# Patient Record
Sex: Female | Born: 1975 | Hispanic: Yes | Marital: Married | Smoking: Never smoker
Health system: Southern US, Community
[De-identification: ages and names within clinical notes are randomized; demographics above are authoritative.]

## PROBLEM LIST (undated history)

## (undated) DIAGNOSIS — J45909 Unspecified asthma, uncomplicated: Secondary | ICD-10-CM

## (undated) HISTORY — PX: TUBAL LIGATION: SHX77

---

## 2004-12-03 ENCOUNTER — Emergency Department: Payer: Self-pay | Admitting: Internal Medicine

## 2005-04-20 ENCOUNTER — Emergency Department: Payer: Self-pay | Admitting: Emergency Medicine

## 2005-08-26 ENCOUNTER — Emergency Department: Payer: Self-pay | Admitting: Emergency Medicine

## 2005-09-22 ENCOUNTER — Emergency Department: Payer: Self-pay | Admitting: Internal Medicine

## 2006-06-24 ENCOUNTER — Emergency Department: Payer: Self-pay | Admitting: Emergency Medicine

## 2006-06-24 ENCOUNTER — Other Ambulatory Visit: Payer: Self-pay

## 2006-08-24 ENCOUNTER — Emergency Department: Payer: Self-pay | Admitting: Emergency Medicine

## 2006-09-09 ENCOUNTER — Emergency Department: Payer: Self-pay | Admitting: Emergency Medicine

## 2006-11-03 ENCOUNTER — Emergency Department: Payer: Self-pay | Admitting: Emergency Medicine

## 2007-09-18 ENCOUNTER — Emergency Department: Payer: Self-pay | Admitting: Unknown Physician Specialty

## 2008-09-28 ENCOUNTER — Emergency Department: Payer: Self-pay | Admitting: Emergency Medicine

## 2013-02-24 ENCOUNTER — Ambulatory Visit: Payer: Self-pay | Admitting: Family Medicine

## 2013-02-24 LAB — BASIC METABOLIC PANEL
Anion Gap: 9 (ref 7–16)
BUN: 8 mg/dL (ref 7–18)
Calcium, Total: 9.1 mg/dL (ref 8.5–10.1)
Chloride: 102 mmol/L (ref 98–107)
Creatinine: 0.73 mg/dL (ref 0.60–1.30)
EGFR (Non-African Amer.): >60
Glucose: 91 mg/dL (ref 65–99)

## 2013-02-24 LAB — CBC WITH DIFFERENTIAL/PLATELET
Basophil %: 0.8 %
Eosinophil %: 1.4 %
HCT: 45 % (ref 35.0–47.0)
Lymphocyte #: 1.1 10*3/uL (ref 1.0–3.6)
Lymphocyte %: 15.3 %
MCHC: 32.1 g/dL (ref 32.0–36.0)
MCV: 81 fL (ref 80–100)
Monocyte #: 0.7 x10 3/mm (ref 0.2–0.9)
Monocyte %: 10.2 %
Neutrophil %: 72.3 %
Platelet: 222 10*3/uL (ref 150–440)
RBC: 5.57 10*6/uL — ABNORMAL HIGH (ref 3.80–5.20)
RDW: 13.5 % (ref 11.5–14.5)
WBC: 7.2 10*3/uL (ref 3.6–11.0)

## 2013-02-24 LAB — SEDIMENTATION RATE: Erythrocyte Sed Rate: 7 mm/hr (ref 0–20)

## 2015-10-17 ENCOUNTER — Other Ambulatory Visit
Admission: RE | Admit: 2015-10-17 | Discharge: 2015-10-17 | Disposition: A | Discharge status: Home / Self Care | Payer: 59 | Source: Ambulatory Visit | Attending: Obstetrics and Gynecology | Admitting: Obstetrics and Gynecology

## 2015-10-17 DIAGNOSIS — Z1329 Encounter for screening for other suspected endocrine disorder: Secondary | ICD-10-CM | POA: Diagnosis present

## 2015-10-17 DIAGNOSIS — Z131 Encounter for screening for diabetes mellitus: Secondary | ICD-10-CM | POA: Diagnosis present

## 2015-10-17 DIAGNOSIS — Z13220 Encounter for screening for lipoid disorders: Secondary | ICD-10-CM | POA: Diagnosis not present

## 2015-10-17 DIAGNOSIS — Z113 Encounter for screening for infections with a predominantly sexual mode of transmission: Secondary | ICD-10-CM | POA: Diagnosis not present

## 2015-10-17 DIAGNOSIS — Z1321 Encounter for screening for nutritional disorder: Secondary | ICD-10-CM | POA: Diagnosis not present

## 2015-10-17 DIAGNOSIS — Z136 Encounter for screening for cardiovascular disorders: Secondary | ICD-10-CM | POA: Diagnosis present

## 2015-10-17 LAB — COMPREHENSIVE METABOLIC PANEL
ALBUMIN: 4.4 g/dL (ref 3.5–5.0)
ALT: 20 U/L (ref 14–54)
ANION GAP: 9 (ref 5–15)
AST: 22 U/L (ref 15–41)
Alkaline Phosphatase: 40 U/L (ref 38–126)
BUN: 10 mg/dL (ref 6–20)
CO2: 28 mmol/L (ref 22–32)
Calcium: 9.4 mg/dL (ref 8.9–10.3)
Chloride: 103 mmol/L (ref 101–111)
Creatinine, Ser: 0.62 mg/dL (ref 0.44–1.00)
GFR calc Af Amer: >60 mL/min (ref >60)
GFR calc non Af Amer: >60 mL/min (ref >60)
GLUCOSE: 106 mg/dL — AB (ref 65–99)
POTASSIUM: 4 mmol/L (ref 3.5–5.1)
SODIUM: 140 mmol/L (ref 135–145)
Total Bilirubin: 0.9 mg/dL (ref 0.3–1.2)
Total Protein: 8 g/dL (ref 6.5–8.1)

## 2015-10-17 LAB — CBC WITH DIFFERENTIAL/PLATELET
BASOS ABS: 0.1 10*3/uL (ref 0–0.1)
Basophils Relative: 1 %
EOS ABS: 0 10*3/uL (ref 0–0.7)
EOS PCT: 1 %
HCT: 42.2 % (ref 35.0–47.0)
Hemoglobin: 13.6 g/dL (ref 12.0–16.0)
LYMPHS ABS: 1.7 10*3/uL (ref 1.0–3.6)
Lymphocytes Relative: 25 %
MCH: 26.1 pg (ref 26.0–34.0)
MCHC: 32.2 g/dL (ref 32.0–36.0)
MCV: 81 fL (ref 80.0–100.0)
MONO ABS: 0.5 10*3/uL (ref 0.2–0.9)
Monocytes Relative: 8 %
Neutro Abs: 4.4 10*3/uL (ref 1.4–6.5)
Neutrophils Relative %: 65 %
PLATELETS: 250 10*3/uL (ref 150–440)
RBC: 5.2 MIL/uL (ref 3.80–5.20)
RDW: 14.2 % (ref 11.5–14.5)
WBC: 6.7 10*3/uL (ref 3.6–11.0)

## 2015-10-17 LAB — RAPID HIV SCREEN (HIV 1/2 AB+AG)
HIV 1/2 Antibodies: NONREACTIVE
HIV-1 P24 Antigen - HIV24: NONREACTIVE

## 2015-10-17 LAB — TSH: TSH: 2.285 u[IU]/mL (ref 0.350–4.500)

## 2015-10-17 LAB — LIPID PANEL
CHOL/HDL RATIO: 2.9 ratio
Cholesterol: 170 mg/dL (ref 0–200)
HDL: 58 mg/dL (ref >40)
LDL CALC: 102 mg/dL — AB (ref 0–99)
Triglycerides: 48 mg/dL (ref <150)
VLDL: 10 mg/dL (ref 0–40)

## 2015-10-18 LAB — VITAMIN D 25 HYDROXY (VIT D DEFICIENCY, FRACTURES): VIT D 25 HYDROXY: 18.6 ng/mL — AB (ref 30.0–100.0)

## 2015-10-18 LAB — RPR: RPR: NONREACTIVE

## 2015-10-30 ENCOUNTER — Emergency Department: Payer: 59

## 2015-10-30 ENCOUNTER — Emergency Department
Admission: EM | Admit: 2015-10-30 | Discharge: 2015-10-30 | Disposition: A | Discharge status: Home / Self Care | Payer: 59 | Attending: Emergency Medicine | Admitting: Emergency Medicine

## 2015-10-30 ENCOUNTER — Encounter: Payer: Self-pay | Admitting: Emergency Medicine

## 2015-10-30 DIAGNOSIS — R0602 Shortness of breath: Secondary | ICD-10-CM | POA: Insufficient documentation

## 2015-10-30 DIAGNOSIS — R42 Dizziness and giddiness: Secondary | ICD-10-CM | POA: Insufficient documentation

## 2015-10-30 DIAGNOSIS — J45909 Unspecified asthma, uncomplicated: Secondary | ICD-10-CM | POA: Insufficient documentation

## 2015-10-30 DIAGNOSIS — R0789 Other chest pain: Secondary | ICD-10-CM | POA: Insufficient documentation

## 2015-10-30 DIAGNOSIS — R079 Chest pain, unspecified: Secondary | ICD-10-CM

## 2015-10-30 HISTORY — DX: Unspecified asthma, uncomplicated: J45.909

## 2015-10-30 LAB — CBC
HEMATOCRIT: 40.4 % (ref 35.0–47.0)
Hemoglobin: 13.3 g/dL (ref 12.0–16.0)
MCH: 26.5 pg (ref 26.0–34.0)
MCHC: 33 g/dL (ref 32.0–36.0)
MCV: 80.4 fL (ref 80.0–100.0)
PLATELETS: 261 10*3/uL (ref 150–440)
RBC: 5.02 MIL/uL (ref 3.80–5.20)
RDW: 14.2 % (ref 11.5–14.5)
WBC: 8.1 10*3/uL (ref 3.6–11.0)

## 2015-10-30 LAB — BASIC METABOLIC PANEL
Anion gap: 6 (ref 5–15)
BUN: 10 mg/dL (ref 6–20)
CHLORIDE: 101 mmol/L (ref 101–111)
CO2: 30 mmol/L (ref 22–32)
CREATININE: 0.72 mg/dL (ref 0.44–1.00)
Calcium: 9.5 mg/dL (ref 8.9–10.3)
GFR calc Af Amer: >60 mL/min (ref >60)
GFR calc non Af Amer: >60 mL/min (ref >60)
GLUCOSE: 94 mg/dL (ref 65–99)
POTASSIUM: 3.7 mmol/L (ref 3.5–5.1)
Sodium: 137 mmol/L (ref 135–145)

## 2015-10-30 LAB — POCT PREGNANCY, URINE: Preg Test, Ur: NEGATIVE

## 2015-10-30 LAB — TROPONIN I

## 2015-10-30 MED ORDER — MECLIZINE HCL 25 MG PO TABS
25.0000 mg | ORAL_TABLET | Freq: Once | ORAL | Status: AC
  Administered 2015-10-30: 25 mg via ORAL
  Filled 2015-10-30: qty 1

## 2015-10-30 MED ORDER — MECLIZINE HCL 25 MG PO TABS: 25.0000 mg | ORAL_TABLET | Freq: Three times a day (TID) | ORAL | Status: DC | PRN

## 2015-10-30 MED ORDER — GI COCKTAIL ~~LOC~~
30.0000 mL | Freq: Once | ORAL | Status: AC
  Administered 2015-10-30: 30 mL via ORAL
  Filled 2015-10-30: qty 30

## 2015-10-30 NOTE — ED Notes (Signed)
Pt with asthma symptoms x3 weeks , chest tightness, off balance, and weakness x2 weeks. The last 2 weeks when awaking needs to have a breathing treatment (tightness).

## 2015-10-30 NOTE — ED Provider Notes (Signed)
[facility] Emergency Department Provider Note    ____________________________________________  Time seen: ~1450  I have reviewed the triage vital signs and the nursing notes.   HISTORY  Chief Complaint Chest Pain   History limited by: Not Limited   HPI Hailey BillingsCarla M Clark is a 40 y.o. female  who presents to the emergency department today with primary complaint of chest pain. She states that is located on the left chest. It has been present for a few weeks. Does not sound like there are any factors that make it worse. She states that today has been constant throughout the day. Patient states she also has been having issues with her asthma. She states she's been having increasing shortness of breath for at least the past 3 weeks. She states that it is worse in the morning. She will use her inhaler and then will get better. In addition the patient states she has also been feeling slightly dizzy. She states this will happen sometimes when she is walking. This too is been going on for a little while but has been getting worse. Patient denies any recent fevers.   Past Medical History  Diagnosis Date  . Asthma     There are no active problems to display for this patient.   History reviewed. No pertinent past surgical history.  No current outpatient prescriptions on file.  Allergies Review of patient's allergies indicates no known allergies.  No family history on file.  Social History Social History  Substance Use Topics  . Smoking status: Never Smoker   . Smokeless tobacco: None  . Alcohol Use: None    Review of Systems  Constitutional: Negative for fever. Cardiovascular: Positive for chest pain. Respiratory: Positive for shortness of breath. Gastrointestinal: Negative for abdominal pain, vomiting and diarrhea. Neurological: Negative for headaches, focal weakness or numbness.  10-point ROS otherwise  negative.  ____________________________________________   PHYSICAL EXAM:  VITAL SIGNS: ED Triage Vitals  Enc Vitals Group     BP 10/30/15 1440 122/56 mmHg     Pulse Rate 10/30/15 1440 64     Resp 10/30/15 1440 18     Temp 10/30/15 1440 97.8 F (36.6 C)     Temp Source 10/30/15 1440 Oral     SpO2 10/30/15 1440 98 %     Weight 10/30/15 1440 150 lb (68.04 kg)     Height 10/30/15 1440 5\' 2"  (1.575 m)     Head Cir --      Peak Flow --      Pain Score 10/30/15 1441 3   Constitutional: Alert and oriented. Well appearing and in no distress. Eyes: Conjunctivae are normal. PERRL. Normal extraocular movements. ENT   Head: Normocephalic and atraumatic.   Nose: No congestion/rhinnorhea.   Mouth/Throat: Mucous membranes are moist.   Neck: No stridor. Hematological/Lymphatic/Immunilogical: No cervical lymphadenopathy. Cardiovascular: Normal rate, regular rhythm.  No murmurs, rubs, or gallops. Respiratory: Normal respiratory effort without tachypnea nor retractions. Breath sounds are clear and equal bilaterally. No wheezes/rales/rhonchi. Gastrointestinal: Soft and nontender. No distention. There is no CVA tenderness.  Genitourinary: Deferred Musculoskeletal: Normal range of motion in all extremities. No joint effusions.  No lower extremity tenderness nor edema. Neurologic:  Normal speech and language. Face symmetric. Tongue midline. Extraocular motion is intact. Web designererl. Strength 5 out of 5 in upper and lower extremities. No drift. Finger to nose normal. Sensation grossly intact. No gross focal neurologic deficits are appreciated.  Skin:  Skin is warm, dry and intact. No rash  noted. Psychiatric: Mood and affect are normal. Speech and behavior are normal. Patient exhibits appropriate insight and judgment.  ____________________________________________    LABS (pertinent positives/negatives)  Labs Reviewed  BASIC METABOLIC PANEL  CBC  TROPONIN I      ____________________________________________   EKG  I, Phineas Semen, attending physician, personally viewed and interpreted this EKG  EKG Time: 1436 Rate: 63 Rhythm: Normal sinus rhythm Axis: normal Intervals: qtc 392 QRS: narrow ST changes: no st elevation Impression: normal ekg  ____________________________________________    RADIOLOGY  CXR  IMPRESSION: No active cardiopulmonary disease.  ____________________________________________   PROCEDURES  Procedure(s) performed: None  Critical Care performed: No  ____________________________________________   INITIAL IMPRESSION / ASSESSMENT AND PLAN / ED COURSE  Pertinent labs & imaging results that were available during my care of the patient were reviewed by me and considered in my medical decision making (see chart for details).  Patient presented to the emergency department today because of concerns for chest pain, shortness breath and some dizziness. On exam patient's lungs are clear. Chest x-ray without concerning findings. Additionally patient has negative troponin. Given that her symptoms have been going on for 3 weeks and that they're constant today would expect some elevation of his truly represent ACS. Will plan on trying GI cocktail. Additionally will give meclizine.  ----------------------------------------- 5:47 PM on 10/30/2015 -----------------------------------------  Patient did not feel any significant improvement with the GI cocktail. This point unclear etiology of the chest pain however and I do doubt ACS. Doubt PE. Will plan on giving patient Antivert. Will give patient primary care follow-up options.  ____________________________________________   FINAL CLINICAL IMPRESSION(S) / ED DIAGNOSES  Final diagnoses:  Dizziness  Shortness of breath  Chest pain, unspecified chest pain type     Phineas Semen, MD 10/30/15 1747

## 2015-10-30 NOTE — ED Notes (Signed)
MD at bedside. 

## 2015-10-30 NOTE — Discharge Instructions (Signed)
Please seek medical attention for any high fevers, chest pain, shortness of breath, change in behavior, persistent vomiting, bloody stool or any other new or concerning symptoms. ° ° °Nonspecific Chest Pain °It is often hard to find the cause of chest pain. There is always a chance that your pain could be related to something serious, such as a heart attack or a blood clot in your lungs. Chest pain can also be caused by conditions that are not life-threatening. If you have chest pain, it is very important to follow up with your doctor. ° °HOME CARE °· If you were prescribed an antibiotic medicine, finish it all even if you start to feel better. °· Avoid any activities that cause chest pain. °· Do not use any tobacco products, including cigarettes, chewing tobacco, or electronic cigarettes. If you need help quitting, ask your doctor. °· Do not drink alcohol. °· Take medicines only as told by your doctor. °· Keep all follow-up visits as told by your doctor. This is important. This includes any further testing if your chest pain does not go away. °· Your doctor may tell you to keep your head raised (elevated) while you sleep. °· Make lifestyle changes as told by your doctor. These may include: °¨ Getting regular exercise. Ask your doctor to suggest some activities that are safe for you. °¨ Eating a heart-healthy diet. Your doctor or a diet specialist (dietitian) can help you to learn healthy eating options. °¨ Maintaining a healthy weight. °¨ Managing diabetes, if necessary. °¨ Reducing stress. °GET HELP IF: °· Your chest pain does not go away, even after treatment. °· You have a rash with blisters on your chest. °· You have a fever. °GET HELP RIGHT AWAY IF: °· Your chest pain is worse. °· You have an increasing cough, or you cough up blood. °· You have severe belly (abdominal) pain. °· You feel extremely weak. °· You pass out (faint). °· You have chills. °· You have sudden, unexplained chest discomfort. °· You have  sudden, unexplained discomfort in your arms, back, neck, or jaw. °· You have shortness of breath at any time. °· You suddenly start to sweat, or your skin gets clammy. °· You feel nauseous. °· You vomit. °· You suddenly feel light-headed or dizzy. °· Your heart begins to beat quickly, or it feels like it is skipping beats. °These symptoms may be an emergency. Do not wait to see if the symptoms will go away. Get medical help right away. Call your local emergency services (911 in the [place] Do not drive yourself to the hospital. °  °This information is not intended to replace advice given to you by your health care provider. Make sure you discuss any questions you have with your health care provider. °  °Document Released: 11/18/2007 Document Revised: 06/22/2014 Document Reviewed: 01/05/2014 °Elsevier Interactive Patient Education ©2016 [employer] ° °

## 2016-07-03 ENCOUNTER — Ambulatory Visit
Admission: EM | Admit: 2016-07-03 | Discharge: 2016-07-03 | Disposition: A | Discharge status: Home / Self Care | Payer: 59 | Attending: Family Medicine | Admitting: Family Medicine

## 2016-07-03 ENCOUNTER — Ambulatory Visit (INDEPENDENT_AMBULATORY_CARE_PROVIDER_SITE_OTHER): Payer: 59

## 2016-07-03 ENCOUNTER — Encounter: Payer: Self-pay | Admitting: Emergency Medicine

## 2016-07-03 DIAGNOSIS — J069 Acute upper respiratory infection, unspecified: Secondary | ICD-10-CM

## 2016-07-03 DIAGNOSIS — J209 Acute bronchitis, unspecified: Secondary | ICD-10-CM

## 2016-07-03 DIAGNOSIS — R6889 Other general symptoms and signs: Secondary | ICD-10-CM

## 2016-07-03 LAB — RAPID INFLUENZA A&B ANTIGENS (ARMC ONLY)
INFLUENZA A (ARMC): NEGATIVE
INFLUENZA B (ARMC): NEGATIVE

## 2016-07-03 MED ORDER — PREDNISONE 10 MG (21) PO TBPK: ORAL_TABLET | ORAL | 0 refills | Status: DC

## 2016-07-03 MED ORDER — HYDROCOD POLST-CPM POLST ER 10-8 MG/5ML PO SUER: 5.0000 mL | Freq: Two times a day (BID) | ORAL | 0 refills | Status: DC | PRN

## 2016-07-03 MED ORDER — ALBUTEROL SULFATE HFA 108 (90 BASE) MCG/ACT IN AERS: 2.0000 | INHALATION_SPRAY | RESPIRATORY_TRACT | 1 refills | Status: DC | PRN

## 2016-07-03 MED ORDER — IPRATROPIUM-ALBUTEROL 0.5-2.5 (3) MG/3ML IN SOLN
3.0000 mL | Freq: Once | RESPIRATORY_TRACT | Status: AC
  Administered 2016-07-03: 3 mL via RESPIRATORY_TRACT

## 2016-07-03 MED ORDER — AZITHROMYCIN 250 MG PO TABS: ORAL_TABLET | ORAL | 0 refills | Status: DC

## 2016-07-03 MED ORDER — METHYLPREDNISOLONE SODIUM SUCC 125 MG IJ SOLR
125.0000 mg | Freq: Once | INTRAMUSCULAR | Status: AC
  Administered 2016-07-03: 125 mg via INTRAMUSCULAR

## 2016-07-03 NOTE — ED Provider Notes (Addendum)
[facility]    CSN: [id number] Arrival date & time: 07/03/16  0854     History   Chief Complaint Chief Complaint  Patient presents with  . Headache  . Diarrhea  . Cough    HPI Hailey BillingsCarla M Clark is a 41 y.o. female.   Patient's here because of URI-like symptoms. She states she was at work Tuesday felt lightheaded dizzy and felt like she was brought to pass out and felt nauseous. She went to lunch was unable to eat came back to her workplace and had a first responder who saw her and found that her blood pressure was low on Tuesday. Apparently Wednesday she ached all during the day felt miserable continued to cough 18 to have chest tightness as well. She has asthma and breathing treatments but didn't help much. Thursday she felt worse came here to be seen and evaluated since we also closed on Thursday she went home still going to the hospital. She is back today on Friday just states she feels so much worse and feels miserable. She's had chills and fever and some myalgia. She's been coughing she feels that she has flu also feels nauseous and has had diarrhea as well. Is not really coughing up much but she feels "tight in chest since she's taken several breathing treatments which does not help much. As stated she does have history of asthma. Does not smoke states that she was around several sick people last weekend as she is seeing her brother who was admitted to the hospital. She never smoked. She denies any pertinent family medical history relevant to today's visit other than her brother being sick with URI illness   The history is provided by the patient. No language interpreter was used.  Headache  Pain location:  Generalized Chronicity:  New Similar to prior headaches: no   Context: activity   Relieved by:  Nothing Ineffective treatments:  None tried Associated symptoms: cough and diarrhea   Cough:    Cough characteristics:  Productive and non-productive   Sputum  characteristics:  Yellow and green   Severity:  Moderate   Onset quality:  Sudden   Timing:  Constant   Progression:  Worsening   Chronicity:  New Diarrhea  Associated symptoms: headaches   Cough  Associated symptoms: headaches     Past Medical History:  Diagnosis Date  . Asthma     There are no active problems to display for this patient.   History reviewed. No pertinent surgical history.  OB History    No data available       Home Medications    Prior to Admission medications   Medication Sig Start Date End Date Taking? Authorizing Provider  albuterol (PROVENTIL HFA;VENTOLIN HFA) 108 (90 Base) MCG/ACT inhaler Inhale 2 puffs into the lungs every 4 (four) hours as needed for wheezing or shortness of breath. 07/03/16   Hassan RowanEugene Keeghan Mcintire, MD  azithromycin (ZITHROMAX Z-PAK) 250 MG tablet Take 2 tablets first day and then 1 po a day for 4 days 07/03/16   Hassan RowanEugene Braylen Denunzio, MD  chlorpheniramine-HYDROcodone [facility](TUSSIONEX PENNKINETIC ER) 10-8 MG/5ML SUER Take 5 mLs by mouth every 12 (twelve) hours as needed for cough. 07/03/16   Hassan RowanEugene Josetta Wigal, MD  meclizine (ANTIVERT) 25 MG tablet Take 1 tablet (25 mg total) by mouth 3 (three) times daily as needed for dizziness. 10/30/15   Phineas SemenGraydon Goodman, MD  predniSONE (STERAPRED UNI-PAK 21 TAB) 10 MG (21) TBPK tablet Sig 6 tablet day 1, 5 tablets  day 2, 4 tablets day 3,,3tablets day 4, 2 tablets day 5, 1 tablet day 6 take all tablets orally 07/03/16   Hassan Rowan, MD    Family History History reviewed. No pertinent family history.  Social History Social History  Substance Use Topics  . Smoking status: Never Smoker  . Smokeless tobacco: Never Used  . Alcohol use Not on file     Allergies   Patient has no known allergies.   Review of Systems Review of Systems  Respiratory: Positive for cough.   Gastrointestinal: Positive for diarrhea.  Neurological: Positive for headaches.  All other systems reviewed and are negative.    Physical Exam Triage  Vital Signs ED Triage Vitals  Enc Vitals Group     BP 07/03/16 0906 123/65     Pulse Rate 07/03/16 0906 (!) 110     Resp 07/03/16 0906 16     Temp 07/03/16 0906 98.8 F (37.1 C)     Temp Source 07/03/16 0906 Oral     SpO2 07/03/16 0906 99 %     Weight 07/03/16 0905 150 lb (68 kg)     Height 07/03/16 0905 5\' 2"  (1.575 m)     Head Circumference --      Peak Flow --      Pain Score 07/03/16 0907 8     Pain Loc --      Pain Edu? --      Excl. in GC? --    No data found.   Updated Vital Signs BP 123/65 (BP Location: Left Arm)   Pulse (!) 110   Temp 98.8 F (37.1 C) (Oral)   Resp 16   Ht 5\' 2"  (1.575 m)   Wt 150 lb (68 kg)   LMP 07/02/2016 (Exact Date) Comment: denies preg  SpO2 99%   BMI 27.44 kg/m   Visual Acuity Right Eye Distance:   Left Eye Distance:   Bilateral Distance:    Right Eye Near:   Left Eye Near:    Bilateral Near:     Physical Exam  Constitutional: She appears well-developed and well-nourished. She appears ill.  Eyes: Pupils are equal, round, and reactive to light.  Neck: Normal range of motion. Neck supple. No tracheal deviation present. No thyromegaly present.  Cardiovascular: Normal rate and regular rhythm.   Pulmonary/Chest: She has wheezes. She has rales in the left lower field.  Musculoskeletal: Normal range of motion.  Neurological: She is alert.  Skin: Skin is warm.  Psychiatric: She has a normal mood and affect.  Vitals reviewed.    UC Treatments / Results  Labs (all labs ordered are listed, but only abnormal results are displayed) Labs Reviewed  RAPID INFLUENZA A&B ANTIGENS [facility] ONLY)    EKG  EKG Interpretation None       Radiology Dg Chest 2 View  Result Date: 07/03/2016 CLINICAL DATA:  Cough EXAM: CHEST  2 VIEW COMPARISON:  10/30/15 FINDINGS: Normal heart size and mediastinal contours. No acute infiltrate or edema. No effusion or pneumothorax. 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 No acute osseous findings. IMPRESSION: No active  cardiopulmonary disease. Electronically Signed   By: Marnee Spring M.D.   On: 07/03/2016 10:22    Procedures Procedures (including critical care time)  Medications Ordered in UC Medications  methylPREDNISolone sodium succinate (SOLU-MEDROL) 125 mg/2 mL injection 125 mg (125 mg Intramuscular Given 07/03/16 1007)  ipratropium-albuterol (DUONEB) 0.5-2.5 (3) MG/3ML nebulizer solution 3 mL (3 mLs Nebulization Given 07/03/16 1009)    Results for  orders placed or performed during the hospital encounter of 07/03/16  Rapid Influenza A&B Antigens ([facility] only)  Result Value Ref Range   Influenza A ([facility]) NEGATIVE NEGATIVE   Influenza B ([facility]) NEGATIVE NEGATIVE   Initial Impression / Assessment and Plan / UC Course  I have reviewed the triage vital signs and the nursing notes.  Pertinent labs & imaging results that were available during my care of the patient were reviewed by me and considered in my medical decision making (see chart for details).   in the patient did have some mild left lower lung field posteriorly chest x-ray was negative. We'll place on Zithromax Z-Pak and Tussionex for cough 1 teaspoon twice a day, albuterol inhaler and will also place her on 6 day prednisone taper pack as well. Given her note for work for Friday Saturday and Sunday with instructions for her to return to work on Monday I cannot give her for Thursday as she requested.   Final Clinical Impressions(s) / UC Diagnoses   Final diagnoses:  Upper respiratory tract infection, unspecified type  Flu-like symptoms  Bronchospasm with bronchitis, acute    New Prescriptions New Prescriptions   ALBUTEROL (PROVENTIL HFA;VENTOLIN HFA) 108 (90 BASE) MCG/ACT INHALER    Inhale 2 puffs into the lungs every 4 (four) hours as needed for wheezing or shortness of breath.   AZITHROMYCIN (ZITHROMAX Z-PAK) 250 MG TABLET    Take 2 tablets first day and then 1 po a day for 4 days   CHLORPHENIRAMINE-HYDROCODONE (TUSSIONEX PENNKINETIC  ER) 10-8 MG/5ML SUER    Take 5 mLs by mouth every 12 (twelve) hours as needed for cough.   PREDNISONE (STERAPRED UNI-PAK 21 TAB) 10 MG (21) TBPK TABLET    Sig 6 tablet day 1, 5 tablets day 2, 4 tablets day 3,,3tablets day 4, 2 tablets day 5, 1 tablet day 6 take all tablets orally    Note: This dictation was prepared with Dragon dictation along with smaller phrase technology. Any transcriptional errors that result from this process are unintentional.   Hassan Rowan, MD 07/03/16 1054    Hassan Rowan, MD 07/03/16 [phone]

## 2016-07-03 NOTE — ED Triage Notes (Signed)
Patient c/o cough, chest congestion, diarrhea, SOB, and HAs that started on Tuesday.

## 2017-01-01 IMAGING — CR DG CHEST 2V
1 series · 2 of 2 positions shown · non-contrast
Comparison: 02/24/2013

CLINICAL DATA: Chest pain for 3 weeks. Weakness in off balance
yesterday.

EXAM:
CHEST  2 VIEW

[Series 1: w chest pa · 0.14mm/px · 2 of 2 slices shown]
[im 1/2]
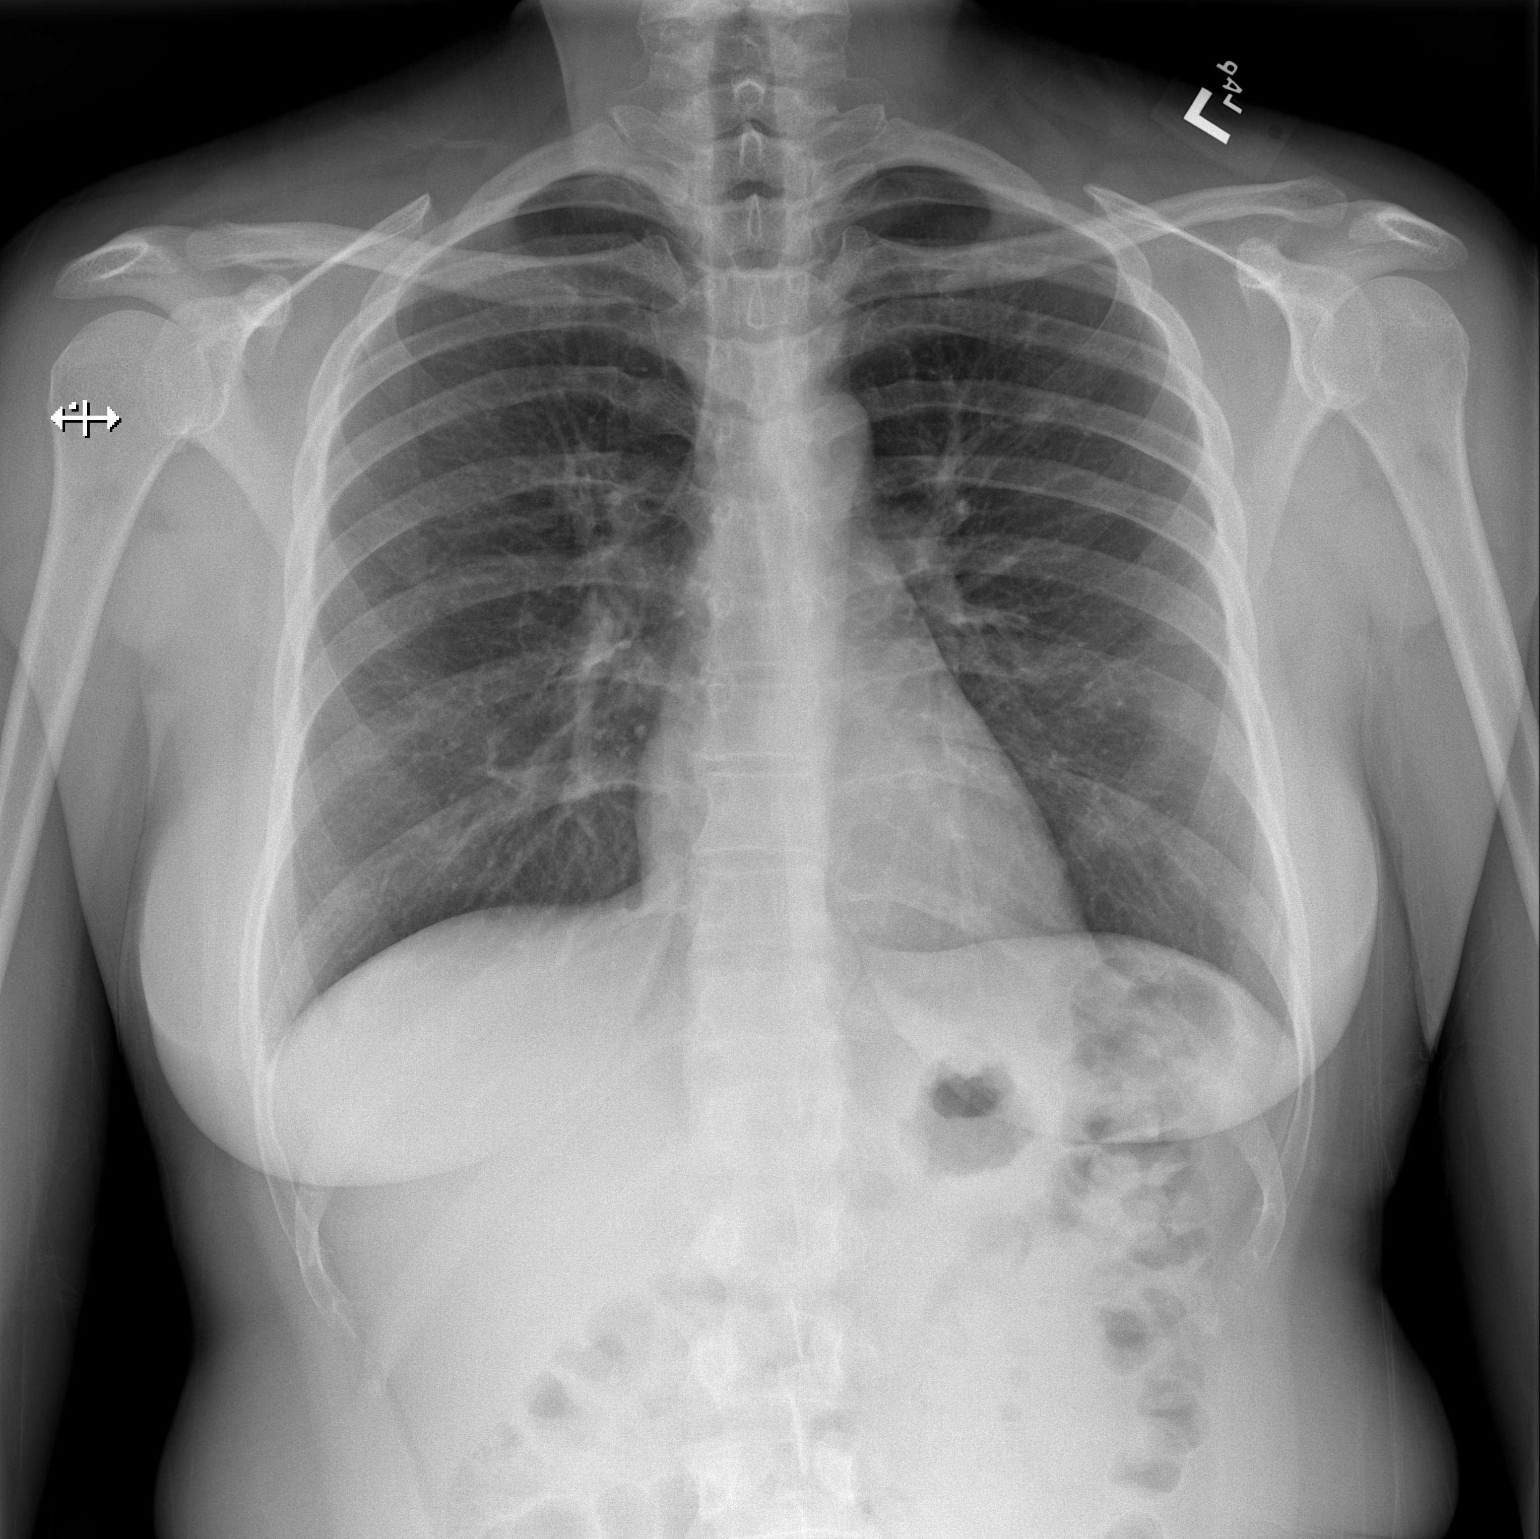
[im 2/2]
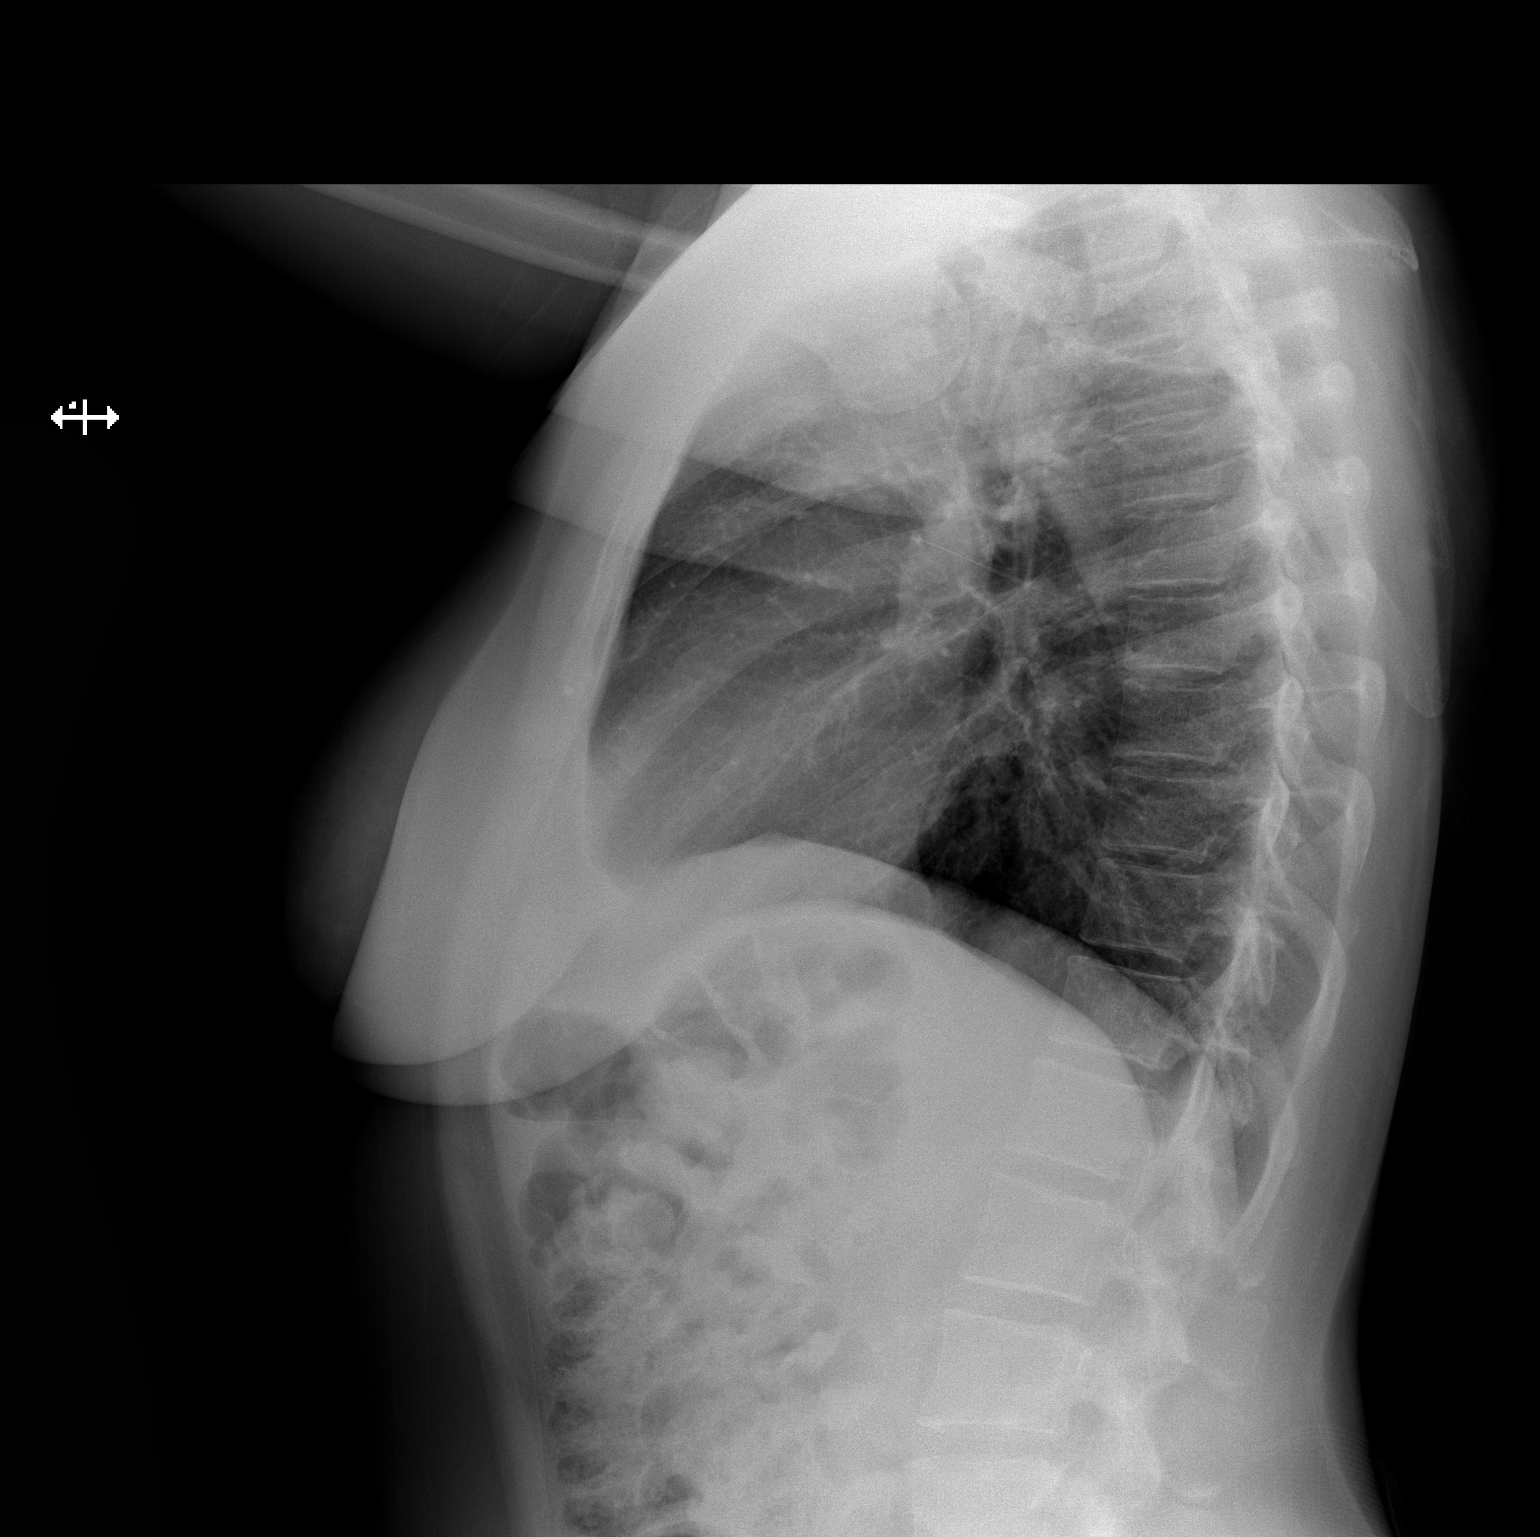

[2 of 2 positions shown; findings below may reference images not displayed]

FINDINGS: The cardiomediastinal silhouette is within normal limits. The lungs
are well inflated and clear. There is no evidence of pleural
effusion or pneumothorax. No acute osseous abnormality is
identified.
IMPRESSION: No active cardiopulmonary disease.

## 2017-09-05 IMAGING — CR DG CHEST 2V
2 series · 2 of 2 positions shown · non-contrast
Comparison: 10/30/15

CLINICAL DATA: Cough

EXAM:
CHEST  2 VIEW

[chest pa]
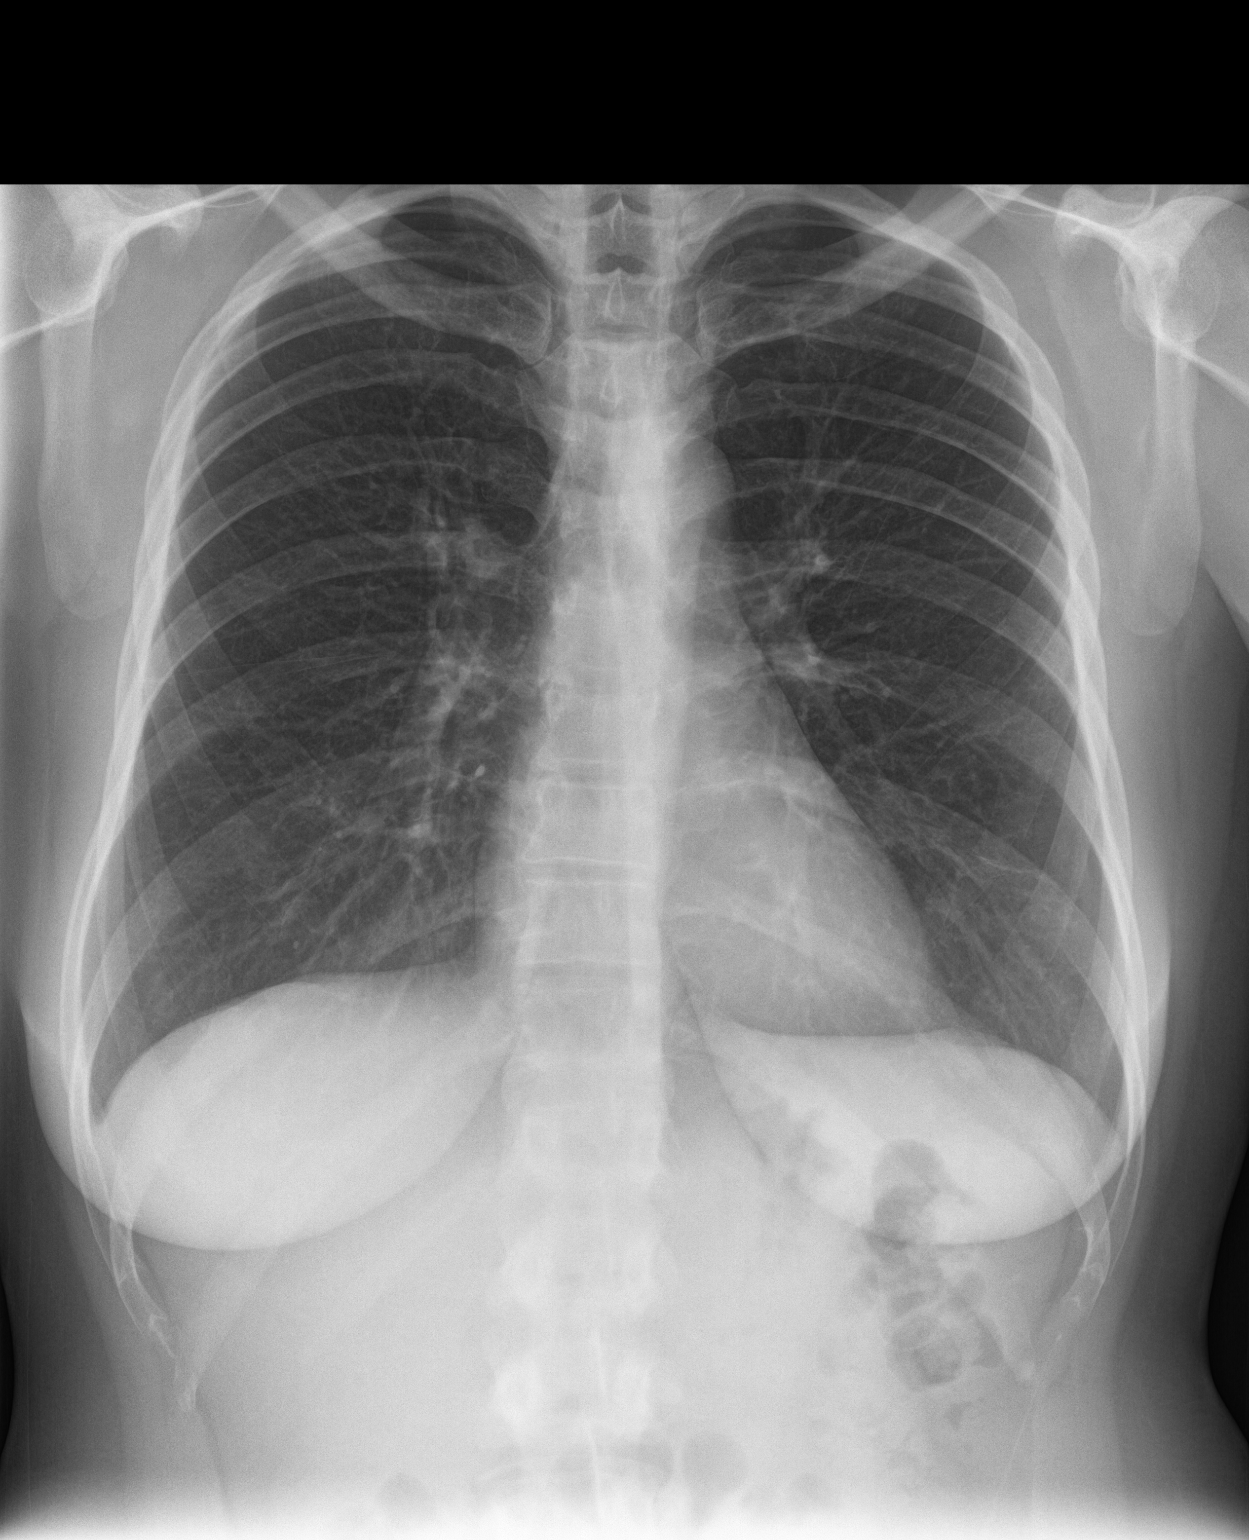

[chest lat]
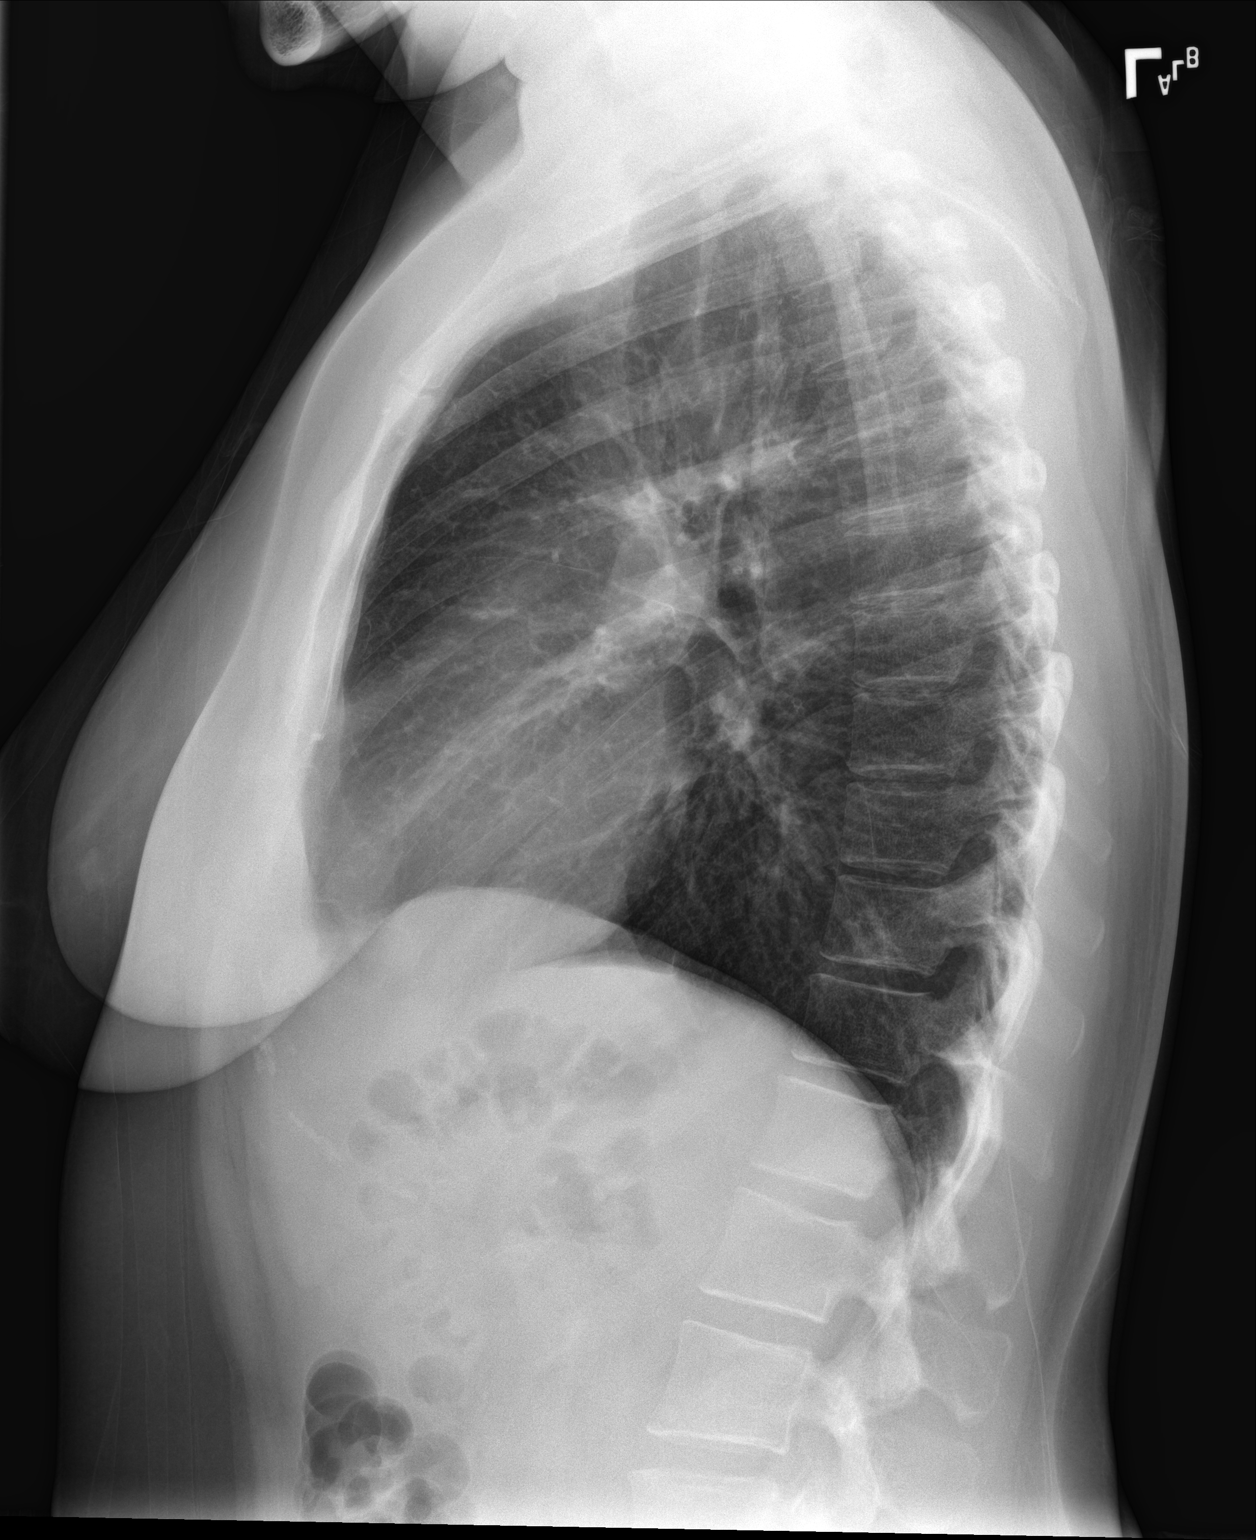

[2 of 2 positions shown; findings below may reference images not displayed]

FINDINGS: Normal heart size and mediastinal contours. No acute infiltrate or
edema. No effusion or pneumothorax. Bifid anterior left 5th rib. No
acute osseous findings.
IMPRESSION: No active cardiopulmonary disease.

## 2017-09-19 ENCOUNTER — Encounter: Payer: Self-pay | Admitting: Emergency Medicine

## 2017-09-19 ENCOUNTER — Emergency Department
Admission: EM | Admit: 2017-09-19 | Discharge: 2017-09-19 | Disposition: A | Discharge status: Home / Self Care | Payer: 59 | Attending: Emergency Medicine | Admitting: Emergency Medicine

## 2017-09-19 DIAGNOSIS — L237 Allergic contact dermatitis due to plants, except food: Secondary | ICD-10-CM | POA: Insufficient documentation

## 2017-09-19 DIAGNOSIS — R21 Rash and other nonspecific skin eruption: Secondary | ICD-10-CM | POA: Diagnosis present

## 2017-09-19 DIAGNOSIS — J45909 Unspecified asthma, uncomplicated: Secondary | ICD-10-CM | POA: Diagnosis not present

## 2017-09-19 DIAGNOSIS — L247 Irritant contact dermatitis due to plants, except food: Secondary | ICD-10-CM

## 2017-09-19 MED ORDER — ALBUTEROL SULFATE HFA 108 (90 BASE) MCG/ACT IN AERS: 2.0000 | INHALATION_SPRAY | RESPIRATORY_TRACT | 1 refills | Status: DC | PRN

## 2017-09-19 MED ORDER — PREDNISONE 10 MG PO TABS: ORAL_TABLET | ORAL | 0 refills | Status: DC

## 2017-09-19 MED ORDER — DEXAMETHASONE SODIUM PHOSPHATE 10 MG/ML IJ SOLN
10.0000 mg | Freq: Once | INTRAMUSCULAR | Status: AC
  Administered 2017-09-19: 10 mg via INTRAMUSCULAR
  Filled 2017-09-19: qty 1

## 2017-09-19 MED ORDER — TRIAMCINOLONE ACETONIDE 0.1 % EX CREA: 1.0000 "application " | TOPICAL_CREAM | Freq: Two times a day (BID) | CUTANEOUS | 0 refills | Status: DC

## 2017-09-19 MED ORDER — DIPHENHYDRAMINE HCL 25 MG PO CAPS
50.0000 mg | ORAL_CAPSULE | Freq: Once | ORAL | Status: AC
  Administered 2017-09-19: 50 mg via ORAL
  Filled 2017-09-19: qty 2

## 2017-09-19 NOTE — ED Provider Notes (Signed)
[facility] Emergency Department Provider Note   ____________________________________________   First MD Initiated Contact with Patient 09/19/17 [phone]     (approximate)  I have reviewed the triage vital signs and the nursing notes.   HISTORY  Chief Complaint Allergic Reaction  HPI Hailey Clark is a 42 y.o. female is here with complaint of rash to her arms, legs, chest, neck and face.  Patient states that 2 days before she noticed the rash she had been working out in the yard.  She is unaware of whether or not she was exposed to poison [place] or poison ivy.  Patient has been itching a great deal and took Benadryl last evening without complete relief of her symptoms.  She denies any airway or respiratory problems.  Patient states she does use an inhaler which is now empty.  She rates her discomfort as 10/10.   Past Medical History:  Diagnosis Date  . Asthma     There are no active problems to display for this patient.   History reviewed. No pertinent surgical history.  Prior to Admission medications   Medication Sig Start Date End Date Taking? Authorizing Provider  albuterol (PROVENTIL HFA;VENTOLIN HFA) 108 (90 Base) MCG/ACT inhaler Inhale 2 puffs into the lungs every 4 (four) hours as needed for wheezing or shortness of breath. 09/19/17   Tommi RumpsSummers, Maylani Embree L, PA-C  meclizine (ANTIVERT) 25 MG tablet Take 1 tablet (25 mg total) by mouth 3 (three) times daily as needed for dizziness. 10/30/15   Phineas SemenGoodman, Graydon, MD  predniSONE (DELTASONE) 10 MG tablet Take 6 tablets  today, on day 2 take 5 tablets, day 3 take 4 tablets, day 4 take 3 tablets, day 5 take  2 tablets and 1 tablet the last day 09/19/17   Tommi RumpsSummers, Terrika Zuver L, PA-C  triamcinolone cream (KENALOG) 0.1 % Apply 1 application topically 2 (two) times daily. 09/19/17   Tommi RumpsSummers, Dionis Autry L, PA-C    Allergies Other  No family history on file.  Social History Social History   Tobacco Use  . Smoking status: Never  Smoker  . Smokeless tobacco: Never Used  Substance Use Topics  . Alcohol use: Not on file  . Drug use: Not on file    Review of Systems Constitutional: No fever/chills Eyes: No visual changes. ENT: No sore throat. Cardiovascular: Denies chest pain. Respiratory: Denies shortness of breath. Gastrointestinal:   No nausea, no vomiting.  Musculoskeletal: Negative for muscle aches. Skin: Positive for rash. Neurological: Negative for headaches, focal weakness or numbness. ____________________________________________   PHYSICAL EXAM:  VITAL SIGNS: ED Triage Vitals [09/19/17 0907]  Enc Vitals Group     BP 112/70     Pulse Rate 63     Resp 17     Temp 98 F (36.7 C)     Temp Source Oral     SpO2 100 %     Weight 156 lb (70.8 kg)     Height 5\' 2"  (1.575 m)     Head Circumference      Peak Flow      Pain Score 10     Pain Loc      Pain Edu?      Excl. in GC?     Constitutional: Alert and oriented. Well appearing and in no acute distress. Eyes: Conjunctivae are normal.  Head: Atraumatic. Nose: No congestion/rhinnorhea. Mouth/Throat: Mucous membranes are moist.  Oropharynx non-erythematous.  No edema. Neck: No stridor.   Cardiovascular: Normal rate, regular rhythm. Grossly  normal heart sounds.  Good peripheral circulation. Respiratory: Normal respiratory effort.  No retractions. Lungs CTAB. Musculoskeletal: Moves upper and lower extremities without any difficulty.  Normal gait was noted. Neurologic:  Normal speech and language. No gross focal neurologic deficits are appreciated.  Skin:  Skin is warm, dry.  There are multiple small vesicular lesions on the bilateral forearm, wrist, lower extremities and neck.  This is consistent with contact dermatitis.  Most likely plant-based like poison oak, IV or sumac. Psychiatric: Mood and affect are normal. Speech and behavior are normal.  ____________________________________________   LABS (all labs ordered are listed, but only  abnormal results are displayed)  Labs Reviewed - No data to display  PROCEDURES  Procedure(s) performed: None  Procedures  Critical Care performed: No  ____________________________________________   INITIAL IMPRESSION / ASSESSMENT AND PLAN / ED COURSE  As part of my medical decision making, I reviewed the following data within the electronic MEDICAL RECORD NUMBER Notes from prior ED visits and [place] Controlled Substance Database  She was given Decadron 10 mg IM in the department along with Benadryl as needed for itching.  She was given a prescription for prednisone 6-day taper and triamcinolone cream to apply as needed.  Patient was also given a refill on her albuterol inhaler as she has ran out.  She is to follow-up with her PCP or see 1 of the dermatologist listed on her discharge papers if any continued problems.  ____________________________________________   FINAL CLINICAL IMPRESSION(S) / ED DIAGNOSES  Final diagnoses:  Irritant contact dermatitis due to plants, except food     ED Discharge Orders        Ordered    predniSONE (DELTASONE) 10 MG tablet     09/19/17 1023    triamcinolone cream (KENALOG) 0.1 %  2 times daily     09/19/17 1023    albuterol (PROVENTIL HFA;VENTOLIN HFA) 108 (90 Base) MCG/ACT inhaler  Every 4 hours PRN     09/19/17 1027       Note:  This document was prepared using Dragon voice recognition software and may include unintentional dictation errors.    Tommi Rumps, PA-C 09/19/17 1044    Sharyn Creamer, MD 09/19/17 [phone]

## 2017-09-19 NOTE — Discharge Instructions (Signed)
Follow-up with your primary care provider or 1 of the dermatologist listed on your discharge papers.  Begin taking prednisone as directed starting today and tapering down over the next 6 days.  You may also take Benadryl 1 or 2 capsules every 6 hours as needed for itching.  Triamcinolone cream apply to area twice daily as needed for itching.

## 2017-09-19 NOTE — ED Triage Notes (Signed)
Pt reports allergic reaction to unknown substance since yesterday, pt with small clusters of blisters to bilateral arms and legs, chest, neck and face. Pt reports using her inhaler last night but has since run out of medication. Pt's airway intact, even and nonlabored respirations noted.

## 2017-09-19 NOTE — ED Notes (Signed)
See triage note  Presents with some swelling to lips and peri orbital swelling    States this started after eating something that her friend made  Then woke up with rash to arms chest and legs small blisters noted to those areas

## 2017-09-23 ENCOUNTER — Encounter: Payer: Self-pay | Admitting: Emergency Medicine

## 2017-09-23 ENCOUNTER — Other Ambulatory Visit: Payer: Self-pay

## 2017-09-23 ENCOUNTER — Emergency Department
Admission: EM | Admit: 2017-09-23 | Discharge: 2017-09-23 | Disposition: A | Discharge status: Home / Self Care | Payer: Commercial Managed Care - HMO | Attending: Emergency Medicine | Admitting: Emergency Medicine

## 2017-09-23 DIAGNOSIS — J45909 Unspecified asthma, uncomplicated: Secondary | ICD-10-CM | POA: Insufficient documentation

## 2017-09-23 DIAGNOSIS — R21 Rash and other nonspecific skin eruption: Secondary | ICD-10-CM | POA: Diagnosis not present

## 2017-09-23 DIAGNOSIS — T7840XD Allergy, unspecified, subsequent encounter: Secondary | ICD-10-CM | POA: Insufficient documentation

## 2017-09-23 DIAGNOSIS — L259 Unspecified contact dermatitis, unspecified cause: Secondary | ICD-10-CM

## 2017-09-23 MED ORDER — SODIUM CHLORIDE 0.9 % IV BOLUS
1000.0000 mL | Freq: Once | INTRAVENOUS | Status: AC
  Administered 2017-09-23: 1000 mL via INTRAVENOUS

## 2017-09-23 MED ORDER — DIPHENHYDRAMINE HCL 50 MG/ML IJ SOLN
25.0000 mg | Freq: Once | INTRAMUSCULAR | Status: AC
  Administered 2017-09-23: 25 mg via INTRAVENOUS
  Filled 2017-09-23: qty 1

## 2017-09-23 MED ORDER — TRAMADOL HCL 50 MG PO TABS: 50.0000 mg | ORAL_TABLET | Freq: Four times a day (QID) | ORAL | 0 refills | Status: DC | PRN

## 2017-09-23 MED ORDER — HYDROXYZINE HCL 25 MG PO TABS: 50.0000 mg | ORAL_TABLET | Freq: Three times a day (TID) | ORAL | 0 refills | Status: DC | PRN

## 2017-09-23 MED ORDER — CEPHALEXIN 500 MG PO CAPS: 500.0000 mg | ORAL_CAPSULE | Freq: Two times a day (BID) | ORAL | 0 refills | Status: AC

## 2017-09-23 MED ORDER — DEXAMETHASONE SODIUM PHOSPHATE 10 MG/ML IJ SOLN
10.0000 mg | Freq: Once | INTRAMUSCULAR | Status: AC
  Administered 2017-09-23: 10 mg via INTRAMUSCULAR
  Filled 2017-09-23: qty 1

## 2017-09-23 MED ORDER — FAMOTIDINE IN NACL 20-0.9 MG/50ML-% IV SOLN
20.0000 mg | Freq: Once | INTRAVENOUS | Status: AC
  Administered 2017-09-23: 20 mg via INTRAVENOUS
  Filled 2017-09-23: qty 50

## 2017-09-23 MED ORDER — PREDNISONE 10 MG PO TABS: ORAL_TABLET | ORAL | 0 refills | Status: AC

## 2017-09-23 NOTE — ED Provider Notes (Signed)
[facility] Emergency Department Provider Note  ____________________________________________  Time seen: Approximately 11:14 AM  I have reviewed the triage vital signs and the nursing notes.   HISTORY  Chief Complaint Allergic Reaction   HPI Hailey Clark is a 42 y.o. female who presents to the emergency department for treatment and evaluation of allergic reaction.  Patient was evaluated here several days ago for the same.  She states that initially it had been improving, but as she has decreased the dosages of her prednisone it has begun to spread and get worse.  She states she has also been taking Benadryl but is not controlling the itching.  She has had no shortness of breath or cough.  She has also tried using calamine lotion without relief.  Past Medical History:  Diagnosis Date  . Asthma     There are no active problems to display for this patient.   History reviewed. No pertinent surgical history.  Prior to Admission medications   Medication Sig Start Date End Date Taking? Authorizing Provider  albuterol (PROVENTIL HFA;VENTOLIN HFA) 108 (90 Base) MCG/ACT inhaler Inhale 2 puffs into the lungs every 4 (four) hours as needed for wheezing or shortness of breath. 09/19/17   Tommi RumpsSummers, Rhonda L, PA-C  meclizine (ANTIVERT) 25 MG tablet Take 1 tablet (25 mg total) by mouth 3 (three) times daily as needed for dizziness. 10/30/15   Phineas SemenGoodman, Graydon, MD  predniSONE (DELTASONE) 10 MG tablet Take 6 tablets  today, on day 2 take 5 tablets, day 3 take 4 tablets, day 4 take 3 tablets, day 5 take  2 tablets and 1 tablet the last day 09/19/17   Tommi RumpsSummers, Rhonda L, PA-C  triamcinolone cream (KENALOG) 0.1 % Apply 1 application topically 2 (two) times daily. 09/19/17   Tommi RumpsSummers, Rhonda L, PA-C    Allergies Other  History reviewed. No pertinent family history.  Social History Social History   Tobacco Use  . Smoking status: Never Smoker  . Smokeless tobacco: Never Used   Substance Use Topics  . Alcohol use: Not on file  . Drug use: Not on file    Review of Systems  Constitutional: Negative for fever. Respiratory: Negative for cough or shortness of breath.  Musculoskeletal: Negative for myalgias Skin: Positive for pruritic lesions on face, arms, torso, and lower extremities Neurological: Negative for numbness or paresthesias. ____________________________________________   PHYSICAL EXAM:  VITAL SIGNS: ED Triage Vitals  Enc Vitals Group     BP 09/23/17 0911 120/75     Pulse Rate 09/23/17 0909 67     Resp 09/23/17 0909 17     Temp 09/23/17 0909 98.2 F (36.8 C)     Temp Source 09/23/17 0909 Oral     SpO2 09/23/17 0909 100 %     Weight 09/23/17 0911 156 lb (70.8 kg)     Height 09/23/17 0911 5\' 2"  (1.575 m)     Head Circumference --      Peak Flow --      Pain Score 09/23/17 0911 10     Pain Loc --      Pain Edu? --      Excl. in GC? --      Constitutional: Uncomfortable appearing. Eyes: Conjunctivae are clear without discharge or drainage. Nose: No rhinorrhea noted. Mouth/Throat: Airway is patent.  Neck: No stridor. Unrestricted range of motion observed. Lymphatic: No palpable adenopathy Cardiovascular: Capillary refill is <3 seconds.  Respiratory: Respirations are even and unlabored.. Musculoskeletal: Unrestricted range of motion  observed. Neurologic: Awake, alert, and oriented x 4.  Skin: Linear lesions consistent with a contact dermatitis is present on the abdomen and anterior aspects of both forearms plus anterior thighs bilaterally.  Areas that are in the [facility] of bilateral forearms appears to be excoriated and scabbing.  ____________________________________________   LABS (all labs ordered are listed, but only abnormal results are displayed)  Labs Reviewed - No data to display ____________________________________________  EKG  Not indicated. ____________________________________________  RADIOLOGY  Not  indicated. ____________________________________________   PROCEDURES  Procedures ____________________________________________   INITIAL IMPRESSION / ASSESSMENT AND PLAN / ED COURSE  Hailey Clark is a 42 y.o. female who presents to the emergency department for a second evaluation and treatment of a pruritic rash.  While here in the emergency department today, she was given 1 L of normal saline, Benadryl, Pepcid, and Decadron.  She experienced some relief with all of her symptoms but continued to complain of itching.  She will be changed from Benadryl to Vistaril and the dosage of prednisone will be extended to a 12-day tapering pack instead of the 6-day dose.  She was given a referral to dermatology if not improving over the week.  She was encouraged to return to the emergency department for symptoms of change or worsen if she is unable to schedule an appointment.  Medications  sodium chloride 0.9 % bolus 1,000 mL (1,000 mLs Intravenous New Bag/Given 09/23/17 1050)  famotidine (PEPCID) IVPB 20 mg premix (20 mg Intravenous New Bag/Given 09/23/17 1051)  dexamethasone (DECADRON) injection 10 mg (10 mg Intramuscular Given 09/23/17 1053)  diphenhydrAMINE (BENADRYL) injection 25 mg (25 mg Intravenous Given 09/23/17 1051)     Pertinent labs & imaging results that were available during my care of the patient were reviewed by me and considered in my medical decision making (see chart for details). ____________________________________________   FINAL CLINICAL IMPRESSION(S) / ED DIAGNOSES  Final diagnoses:  None    ED Discharge Orders    None       Note:  This document was prepared using Dragon voice recognition software and may include unintentional dictation errors.    Chinita Pester, FNP 09/23/17 1610    Jeanmarie Plant, MD 09/27/17 [phone]

## 2017-09-23 NOTE — ED Notes (Signed)
See triage note.  States she was seen on Sunday for rash  States is still taking meds but areas are getting worse  Positive itching  No resp distress noted

## 2017-09-23 NOTE — ED Notes (Signed)
Pt ambulatory upon discharge. Verbalized understanding of discharge instructions, follow-up care and prescriptions. VSS. Skin warm and dry. A&O x4.  

## 2017-09-23 NOTE — ED Triage Notes (Signed)
Here Sunday for allergic reaction. Got shot then per report and has been taking steroids but still getting worse.  Has also been doing benadryl. Rash is generalized and itches and hurts.

## 2017-09-23 NOTE — ED Notes (Signed)
ED Provider at bedside. 

## 2019-01-09 ENCOUNTER — Ambulatory Visit
Admission: EM | Admit: 2019-01-09 | Discharge: 2019-01-09 | Disposition: A | Discharge status: Home / Self Care | Payer: BC Managed Care – PPO | Attending: Emergency Medicine | Admitting: Emergency Medicine

## 2019-01-09 ENCOUNTER — Encounter: Payer: Self-pay | Admitting: Emergency Medicine

## 2019-01-09 ENCOUNTER — Other Ambulatory Visit: Payer: Self-pay

## 2019-01-09 DIAGNOSIS — F410 Panic disorder [episodic paroxysmal anxiety] without agoraphobia: Secondary | ICD-10-CM | POA: Insufficient documentation

## 2019-01-09 DIAGNOSIS — F419 Anxiety disorder, unspecified: Secondary | ICD-10-CM | POA: Insufficient documentation

## 2019-01-09 MED ORDER — ONDANSETRON HCL 4 MG PO TABS: 4.0000 mg | ORAL_TABLET | Freq: Three times a day (TID) | ORAL | 0 refills | Status: DC | PRN

## 2019-01-09 NOTE — ED Provider Notes (Signed)
[facility]    CSN: [id number] Arrival date & time: 01/09/19  1237      History   Chief Complaint Chief Complaint  Patient presents with  . Dizziness    HPI Hailey Clark is a 43 y.o. female presenting with chest pain, SOB and dizziness. Pt does not have a history of cardiac abnormalities, but does have a history of anxiety. Pt states her anxiety has been increasing over the past few months until she had a dizzy/panic episode this afternoon at work. Pt felt as those her skin was cold and clammy, she couldn't catch her breath and her legs became weak. When she told her manager what occurred, he sent her here to get evaluated. Pt denies any exposure of COVID-19.   Past Medical History:  Diagnosis Date  . Asthma     There are no active problems to display for this patient.   Past Surgical History:  Procedure Laterality Date  . TUBAL LIGATION      OB History   No obstetric history on file.      Home Medications    Prior to Admission medications   Medication Sig Start Date End Date Taking? Authorizing Provider  albuterol (PROVENTIL HFA;VENTOLIN HFA) 108 (90 Base) MCG/ACT inhaler Inhale 2 puffs into the lungs every 4 (four) hours as needed for wheezing or shortness of breath. 09/19/17  Yes Letitia Neri L, PA-C  hydrOXYzine (ATARAX/VISTARIL) 25 MG tablet Take 2 tablets (50 mg total) by mouth 3 (three) times daily as needed. 09/23/17   Triplett, Johnette Abraham B, FNP  meclizine (ANTIVERT) 25 MG tablet Take 1 tablet (25 mg total) by mouth 3 (three) times daily as needed for dizziness. 10/30/15   Nance Pear, MD  ondansetron (ZOFRAN) 4 MG tablet Take 1 tablet (4 mg total) by mouth every 8 (eight) hours as needed for nausea or vomiting. 01/09/19   Gertie Baron, NP  traMADol (ULTRAM) 50 MG tablet Take 1 tablet (50 mg total) by mouth every 6 (six) hours as needed. 09/23/17   Triplett, Johnette Abraham B, FNP  triamcinolone cream (KENALOG) 0.1 % Apply 1 application topically 2 (two)  times daily. 09/19/17   Johnn Hai, PA-C    Family History Family History  Problem Relation Age of Onset  . Hypertension Mother   . Hypertension Child     Social History Social History   Tobacco Use  . Smoking status: Never Smoker  . Smokeless tobacco: Never Used  Substance Use Topics  . Alcohol use: Not Currently  . Drug use: Never     Allergies   Other   Review of Systems Review of Systems  Constitutional: Positive for activity change and chills.  Respiratory: Positive for shortness of breath.   Cardiovascular: Positive for chest pain.  Psychiatric/Behavioral: Positive for decreased concentration. The patient is nervous/anxious.   All other systems reviewed and are negative.    Physical Exam Triage Vital Signs ED Triage Vitals  Enc Vitals Group     BP 01/09/19 1254 (!) 141/91     Pulse Rate 01/09/19 1254 67     Resp 01/09/19 1254 18     Temp 01/09/19 1254 98.4 F (36.9 C)     Temp Source 01/09/19 1254 Oral     SpO2 01/09/19 1254 100 %     Weight 01/09/19 1256 160 lb (72.6 kg)     Height 01/09/19 1256 5\' 3"  (1.6 m)     Head Circumference --      Peak  Flow --      Pain Score 01/09/19 1255 8     Pain Loc --      Pain Edu? --      Excl. in GC? --    No data found.  Updated Vital Signs BP (!) 141/91 (BP Location: Right Arm)   Pulse 67   Temp 98.4 F (36.9 C) (Oral)   Resp 18   Ht 5\' 3"  (1.6 m)   Wt 160 lb (72.6 kg)   SpO2 100%   BMI 28.34 kg/m   Visual Acuity Right Eye Distance:   Left Eye Distance:   Bilateral Distance:    Right Eye Near:   Left Eye Near:    Bilateral Near:     Physical Exam Vitals signs and nursing note reviewed.  Constitutional:      General: She is not in acute distress.    Appearance: She is well-developed.  HENT:     Head: Normocephalic and atraumatic.  Eyes:     Conjunctiva/sclera: Conjunctivae normal.  Neck:     Musculoskeletal: Neck supple.  Cardiovascular:     Rate and Rhythm: Normal rate and  regular rhythm.     Heart sounds: No murmur.  Pulmonary:     Effort: Pulmonary effort is normal. No respiratory distress.     Breath sounds: Normal breath sounds.  Abdominal:     Palpations: Abdomen is soft.     Tenderness: There is no abdominal tenderness.  Skin:    General: Skin is warm and dry.  Neurological:     Mental Status: She is alert.  Psychiatric:        Mood and Affect: Mood normal.        Thought Content: Thought content normal.      UC Treatments / Results  Labs (all labs ordered are listed, but only abnormal results are displayed) Labs Reviewed - No data to display  EKG   Radiology No results found.  Procedures ED EKG  Date/Time: 01/09/2019 6:05 PM Performed by: Bailey MechBenjamin, Amayrani Bennick, NP Authorized by: Tommie Samsook, Jayce G, DO   ECG reviewed by ED Physician in the absence of a cardiologist: yes   Previous ECG:    Previous ECG:  Unavailable Interpretation:    Interpretation: normal   Rate:    ECG rate assessment: normal   Rhythm:    Rhythm: sinus rhythm     (including critical care time)  Medications Ordered in UC Medications - No data to display  Initial Impression / Assessment and Plan / UC Course  I have reviewed the triage vital signs and the nursing notes.  Pertinent labs & imaging results that were available during my care of the patient were reviewed by me and considered in my medical decision making (see chart for details).     Pt presents with chest pain and SOB, diagnosed with anxiety with panic attacks and treated as such with the prescribed medications below. Zofran is to be used when anxiety causes pt to become nauseous. Pt to follow-up with a PCP as soon as possible for a possible medical management of anxiety and therapy.   Final Clinical Impressions(s) / UC Diagnoses   Final diagnoses:  Anxiety  Panic attack   Discharge Instructions   None    ED Prescriptions    Medication Sig Dispense Auth. Provider   ondansetron (ZOFRAN) 4 MG  tablet Take 1 tablet (4 mg total) by mouth every 8 (eight) hours as needed for nausea or vomiting. 15 tablet Sharlet SalinaBenjamin,  Laurine Kuyper, NP     Controlled Substance Prescriptions [place] Controlled Substance Registry consulted? Not Applicable    Bailey MechLunise Jonte Wollam, DNP, NP-c    Bailey MechBenjamin, Jarelle Ates, NP 01/09/19 [phone]

## 2019-01-09 NOTE — ED Triage Notes (Signed)
Patient states she has been dizzy, has had some chest pain and felt her heart was racing.  Patient states she took her BP and it was low for her.

## 2019-07-01 ENCOUNTER — Other Ambulatory Visit: Payer: Self-pay

## 2019-07-01 ENCOUNTER — Ambulatory Visit
Admission: EM | Admit: 2019-07-01 | Discharge: 2019-07-01 | Disposition: A | Discharge status: Home / Self Care | Payer: BC Managed Care – PPO | Attending: Emergency Medicine | Admitting: Emergency Medicine

## 2019-07-01 DIAGNOSIS — U071 COVID-19: Secondary | ICD-10-CM | POA: Diagnosis not present

## 2019-07-01 DIAGNOSIS — R519 Headache, unspecified: Secondary | ICD-10-CM | POA: Diagnosis not present

## 2019-07-01 DIAGNOSIS — Z20822 Contact with and (suspected) exposure to covid-19: Secondary | ICD-10-CM | POA: Diagnosis not present

## 2019-07-01 DIAGNOSIS — R0981 Nasal congestion: Secondary | ICD-10-CM

## 2019-07-01 LAB — GROUP A STREP BY PCR: Group A Strep by PCR: NOT DETECTED

## 2019-07-01 MED ORDER — IBUPROFEN 600 MG PO TABS: 600.0000 mg | ORAL_TABLET | Freq: Four times a day (QID) | ORAL | 0 refills | Status: DC | PRN

## 2019-07-01 MED ORDER — ALBUTEROL SULFATE HFA 108 (90 BASE) MCG/ACT IN AERS: 2.0000 | INHALATION_SPRAY | RESPIRATORY_TRACT | 0 refills | Status: DC | PRN

## 2019-07-01 MED ORDER — AEROCHAMBER PLUS MISC: 2 refills | Status: DC

## 2019-07-01 MED ORDER — BENZONATATE 200 MG PO CAPS: 200.0000 mg | ORAL_CAPSULE | Freq: Three times a day (TID) | ORAL | 0 refills | Status: DC | PRN

## 2019-07-01 MED ORDER — FLUTICASONE PROPIONATE 50 MCG/ACT NA SUSP: 2.0000 | Freq: Every day | NASAL | 0 refills | Status: DC

## 2019-07-01 NOTE — Discharge Instructions (Addendum)
Your strep PCR was negative.  This is not strep throat.  I suspect that this is Covid.  It will take 18 to 48 hours for this to be resulted.  If you sign up for MyChart you will get the result as soon as it comes out.  I would do 2 puffs from your albuterol inhaler using your spacer every 4 hours for the next few days.  This will make the albuterol much more effective.  We can back off on as you start to feel better.  Flonase, saline nasal irrigation, Mucinex D for the nasal congestion, ibuprofen 600 mg combined with 1 g of Tylenol 3-4 times a day as needed for body aches, headaches.  Tessalon for the cough.  Here is a list of primary care providers who are taking new patients:  Dr. Elizabeth Sauer, Dr. Schuyler Amor [address] [place] [address] [phone]  [facility] [address] [place] [address]  [phone]  [facility] [address] [place], [place] [address] [phone]  [facility] [address] [place]  [phone] [place], [place] [address]  Here are clinics/ other resources who will see you if you do not have insurance. Some have certain criteria that you must meet. Call them and find out what they are:  [facility]: [address], [place], [place] [address] Phone: [phone] Hours: First and Third Saturdays of each Month, 9 a.m. - 1 p.m.  [facility]: [address] [place], [place] [address] Phone: [phone] Hours: Tuesday, 4 p.m. - 8 p.m. Thursday, 1 p.m. - 8 p.m. Wednesday, 9 a.m. - [facility] [address], [place], [place] [address] Phone: [phone] Pharmacy Phone Number: [phone] Dental Phone Number: [phone] [facility] Insurance Help: [phone]  Dental Hours: Monday - Thursday, 8 a.m. - 6 p.m.  [facility] [address], [place], [place] [address] Phone: [phone] Pharmacy Phone Number: [phone] [facility]  Insurance Help: [phone]  [facility] [address] [place] [place], [place] [address] Phone: [phone] Pharmacy Phone Number: [phone] [facility] Insurance Help: [phone]  [facility] [address] [place], [place] [address] Phone: [phone] [facility] Insurance Help: [phone]   [facility] [address] [place], [place] [address] Phone: [phone]  Go to www.goodrx.com to look up your medications. This will give you a list of where you can find your prescriptions at the most affordable prices. Or ask the pharmacist what the cash price is, or if they have any other discount programs available to help make your medication more affordable. This can be less expensive than what you would pay with insurance.

## 2019-07-01 NOTE — ED Triage Notes (Signed)
Patient states that she has 3 co-workers that are positive for [employer]. Patient states that she started having body aches, cough, headache, no appetite, nausea since yesterday.

## 2019-07-01 NOTE — ED Provider Notes (Signed)
HPI  SUBJECTIVE:  Hailey Clark is a 44 y.o. female who presents with headaches, nasal congestion, decreased appetite, loss of sense of smell or taste, sore throat, cough, shortness of breath.  She reports tender swollen cervical lymphadenopathy.  She reports nausea, postnasal drip.  Last known exposure to Covid was 3 days ago.  Symptoms started yesterday.  No body aches, fevers, wheezing, vomiting, diarrhea, abdominal pain.  No antipyretic in the past 4 to 6 hours.  She took ibuprofen 400 mg twice daily with improvement in her headaches and has been using her albuterol inhaler 4 times a day with improvement in her cough.  She does not have a spacer.  She did not get a flu shot this year.  Past medical history of asthma.  No history of smoking, diabetes, hypertension, coronary disease, immunocompromise, cancer, HIV.  LMP: 12/21.  Denies the possibility of being pregnant.  PMD: None.  Past Medical History:  Diagnosis Date  . Asthma     Past Surgical History:  Procedure Laterality Date  . TUBAL LIGATION      Family History  Problem Relation Age of Onset  . Hypertension Mother   . Hypertension Child     Social History   Tobacco Use  . Smoking status: Never Smoker  . Smokeless tobacco: Never Used  Substance Use Topics  . Alcohol use: Not Currently  . Drug use: Never    No current facility-administered medications for this encounter.  Current Outpatient Medications:  .  albuterol (VENTOLIN HFA) 108 (90 Base) MCG/ACT inhaler, Inhale 2 puffs into the lungs every 4 (four) hours as needed for wheezing or shortness of breath., Disp: 18 g, Rfl: 0 .  benzonatate (TESSALON) 200 MG capsule, Take 1 capsule (200 mg total) by mouth 3 (three) times daily as needed for cough., Disp: 30 capsule, Rfl: 0 .  fluticasone (FLONASE) 50 MCG/ACT nasal spray, Place 2 sprays into both nostrils daily., Disp: 16 g, Rfl: 0 .  ibuprofen (ADVIL) 600 MG tablet, Take 1 tablet (600 mg total) by mouth every 6 (six)  hours as needed., Disp: 30 tablet, Rfl: 0 .  Spacer/Aero-Holding Chambers (AEROCHAMBER PLUS) inhaler, Use as instructed, Disp: 1 each, Rfl: 2  Allergies  Allergen Reactions  . Other     peanuts     ROS  As noted in HPI.   Physical Exam  BP (!) 116/54 (BP Location: Left Arm)   Pulse 70   Temp 98.7 F (37.1 C) (Oral)   Resp 16   Ht 5\' 2"  (1.575 m)   Wt 68 kg   LMP 06/05/2019   SpO2 100%   BMI 27.44 kg/m   Constitutional: Well developed, well nourished, no acute distress Eyes:  EOMI, conjunctiva normal bilaterally HENT: Normocephalic, atraumatic,mucus membranes moist positive nasal congestion.  No sinus tenderness.  Erythematous, swollen tonsils without exudates, uvula midline.  No postnasal drip. Neck: Positive anterior posterior cervical lymphadenopathy Respiratory: Normal inspiratory effort, lungs clear bilaterally, good air movement. positive anterior and lateral chest wall tenderness Cardiovascular: Normal rate regular rhythm, no murmurs rubs or gallops GI: nondistended skin: No rash, skin intact Musculoskeletal: no deformities Neurologic: Alert & oriented x 3, no focal neuro deficits Psychiatric: Speech and behavior appropriate   ED Course   Medications - No data to display  Orders Placed This Encounter  Procedures  . Novel Coronavirus, NAA (Hosp order, Send-out to Ref Lab; TAT 18-24 hrs    Standing Status:   Standing    Number of Occurrences:  1    Order Specific Question:   Is this test for diagnosis or screening    Answer:   Diagnosis of ill patient    Order Specific Question:   Symptomatic for COVID-19 as defined by CDC    Answer:   Yes    Order Specific Question:   Date of Symptom Onset    Answer:   06/30/2019    Order Specific Question:   Hospitalized for COVID-19    Answer:   No    Order Specific Question:   Admitted to ICU for COVID-19    Answer:   No    Order Specific Question:   Previously tested for COVID-19    Answer:   No    Order  Specific Question:   Resident in a congregate (group) care setting    Answer:   No    Order Specific Question:   Employed in healthcare setting    Answer:   No    Order Specific Question:   Pregnant    Answer:   No  . Group A Strep by PCR    Standing Status:   Standing    Number of Occurrences:   1    Order Specific Question:   Patient immune status    Answer:   Normal    Results for orders placed or performed during the hospital encounter of 07/01/19 (from the past 24 hour(s))  Group A Strep by PCR     Status: None   Collection Time: 07/01/19  9:10 AM   Specimen: Throat; Sterile Swab  Result Value Ref Range   Group A Strep by PCR NOT DETECTED NOT DETECTED   No results found.  ED Clinical Impression  1. Suspected COVID-19 virus infection   2. Exposure to COVID-19 virus   3. Encounter for laboratory testing for COVID-19 virus      ED Assessment/Plan  Suspect Covid, however will check strep PCR because of the enlarged tonsils no lymphadenopathy.  Covid PCR sent.  Strep PCR negative.   Plan to send home with albuterol inhaler with a spacer, Flonase, ibuprofen/Tylenol, Tessalon, Mucinex D, saline nasal irrigation.  Will provide primary care list for ongoing care.  To the ER if she gets worse.   Discussed labs, imaging, MDM, treatment plan, and plan for follow-up with patient. Discussed sn/sx that should prompt return to the ED. patient agrees with plan.   Meds ordered this encounter  Medications  . albuterol (VENTOLIN HFA) 108 (90 Base) MCG/ACT inhaler    Sig: Inhale 2 puffs into the lungs every 4 (four) hours as needed for wheezing or shortness of breath.    Dispense:  18 g    Refill:  0  . fluticasone (FLONASE) 50 MCG/ACT nasal spray    Sig: Place 2 sprays into both nostrils daily.    Dispense:  16 g    Refill:  0  . Spacer/Aero-Holding Chambers (AEROCHAMBER PLUS) inhaler    Sig: Use as instructed    Dispense:  1 each    Refill:  2  . benzonatate (TESSALON) 200 MG  capsule    Sig: Take 1 capsule (200 mg total) by mouth 3 (three) times daily as needed for cough.    Dispense:  30 capsule    Refill:  0  . ibuprofen (ADVIL) 600 MG tablet    Sig: Take 1 tablet (600 mg total) by mouth every 6 (six) hours as needed.    Dispense:  30 tablet  Refill:  0    *This clinic note was created using Scientist, clinical (histocompatibility and immunogenetics). Therefore, there may be occasional mistakes despite careful proofreading.   ?    Domenick Gong, MD 07/01/19 1019

## 2019-07-03 ENCOUNTER — Telehealth (HOSPITAL_COMMUNITY): Payer: Self-pay | Admitting: Emergency Medicine

## 2019-07-03 LAB — NOVEL CORONAVIRUS, NAA (HOSP ORDER, SEND-OUT TO REF LAB; TAT 18-24 HRS): SARS-CoV-2, NAA: DETECTED — AB

## 2019-07-03 NOTE — Telephone Encounter (Signed)
Patient contacted by phone and made aware of   pos covid results. Pt verbalized understanding and had all questions answered.    

## 2019-07-03 NOTE — Telephone Encounter (Signed)

## 2019-12-21 ENCOUNTER — Other Ambulatory Visit: Payer: Self-pay

## 2019-12-21 ENCOUNTER — Ambulatory Visit
Admission: EM | Admit: 2019-12-21 | Discharge: 2019-12-21 | Disposition: A | Discharge status: Home / Self Care | Payer: BC Managed Care – PPO | Attending: Internal Medicine | Admitting: Internal Medicine

## 2019-12-21 ENCOUNTER — Encounter: Payer: Self-pay | Admitting: Emergency Medicine

## 2019-12-21 DIAGNOSIS — K2900 Acute gastritis without bleeding: Secondary | ICD-10-CM | POA: Diagnosis present

## 2019-12-21 DIAGNOSIS — N10 Acute tubulo-interstitial nephritis: Secondary | ICD-10-CM | POA: Diagnosis present

## 2019-12-21 LAB — CBC WITH DIFFERENTIAL/PLATELET
Abs Immature Granulocytes: 0.06 10*3/uL (ref 0.00–0.07)
Basophils Absolute: 0.1 10*3/uL (ref 0.0–0.1)
Basophils Relative: 0 %
Eosinophils Absolute: 0 10*3/uL (ref 0.0–0.5)
Eosinophils Relative: 0 %
HCT: 43.4 % (ref 36.0–46.0)
Hemoglobin: 14.3 g/dL (ref 12.0–15.0)
Immature Granulocytes: 1 %
Lymphocytes Relative: 14 %
Lymphs Abs: 1.6 10*3/uL (ref 0.7–4.0)
MCH: 26.7 pg (ref 26.0–34.0)
MCHC: 32.9 g/dL (ref 30.0–36.0)
MCV: 81.1 fL (ref 80.0–100.0)
Monocytes Absolute: 0.9 10*3/uL (ref 0.1–1.0)
Monocytes Relative: 8 %
Neutro Abs: 8.9 10*3/uL — ABNORMAL HIGH (ref 1.7–7.7)
Neutrophils Relative %: 77 %
Platelets: 297 10*3/uL (ref 150–400)
RBC: 5.35 MIL/uL — ABNORMAL HIGH (ref 3.87–5.11)
RDW: 14.1 % (ref 11.5–15.5)
WBC: 11.5 10*3/uL — ABNORMAL HIGH (ref 4.0–10.5)
nRBC: 0 % (ref 0.0–0.2)

## 2019-12-21 LAB — BASIC METABOLIC PANEL
Anion gap: 7 (ref 5–15)
BUN: 11 mg/dL (ref 6–20)
CO2: 30 mmol/L (ref 22–32)
Calcium: 9.3 mg/dL (ref 8.9–10.3)
Chloride: 101 mmol/L (ref 98–111)
Creatinine, Ser: 0.79 mg/dL (ref 0.44–1.00)
GFR calc Af Amer: >60 mL/min (ref >60)
GFR calc non Af Amer: >60 mL/min (ref >60)
Glucose, Bld: 88 mg/dL (ref 70–99)
Potassium: 3.6 mmol/L (ref 3.5–5.1)
Sodium: 138 mmol/L (ref 135–145)

## 2019-12-21 LAB — URINALYSIS, COMPLETE (UACMP) WITH MICROSCOPIC
Bilirubin Urine: NEGATIVE
Glucose, UA: NEGATIVE mg/dL
Ketones, ur: NEGATIVE mg/dL
Nitrite: NEGATIVE
Protein, ur: 30 mg/dL — AB
Specific Gravity, Urine: 1.015 (ref 1.005–1.030)
WBC, UA: >50 WBC/hpf (ref 0–5)
pH: 6.5 (ref 5.0–8.0)

## 2019-12-21 MED ORDER — ONDANSETRON 4 MG PO TBDP
4.0000 mg | ORAL_TABLET | Freq: Once | ORAL | Status: AC
  Administered 2019-12-21: 4 mg via ORAL

## 2019-12-21 MED ORDER — PANTOPRAZOLE SODIUM 20 MG PO TBEC: 20.0000 mg | DELAYED_RELEASE_TABLET | Freq: Every day | ORAL | 2 refills | Status: DC

## 2019-12-21 MED ORDER — CEPHALEXIN 500 MG PO CAPS: 500.0000 mg | ORAL_CAPSULE | Freq: Four times a day (QID) | ORAL | 0 refills | Status: AC

## 2019-12-21 MED ORDER — ONDANSETRON 4 MG PO TBDP: 4.0000 mg | ORAL_TABLET | Freq: Three times a day (TID) | ORAL | 0 refills | Status: DC | PRN

## 2019-12-21 MED ORDER — LIDOCAINE VISCOUS HCL 2 % MT SOLN
15.0000 mL | Freq: Once | OROMUCOSAL | Status: AC
  Administered 2019-12-21: 15 mL via ORAL

## 2019-12-21 MED ORDER — SUCRALFATE 1 G PO TABS: 1.0000 g | ORAL_TABLET | Freq: Three times a day (TID) | ORAL | 0 refills | Status: DC

## 2019-12-21 MED ORDER — ALBUTEROL SULFATE HFA 108 (90 BASE) MCG/ACT IN AERS: 2.0000 | INHALATION_SPRAY | RESPIRATORY_TRACT | 0 refills | Status: DC | PRN

## 2019-12-21 MED ORDER — ALUM & MAG HYDROXIDE-SIMETH 200-200-20 MG/5ML PO SUSP
30.0000 mL | Freq: Once | ORAL | Status: AC
  Administered 2019-12-21: 30 mL via ORAL

## 2019-12-21 NOTE — ED Provider Notes (Signed)
[facility]    CSN: [id number] Arrival date & time: 12/21/19  1431      History   Chief Complaint Chief Complaint  Patient presents with  . Bloated  . Abdominal Pain    HPI Hailey Clark is a 44 y.o. female comes to urgent care complaining of epigastric abdominal pain.  Pain is of moderate severity. It is aggravated by eating food. No known relieving factors. Is associated with some nausea and vomiting. She had an episode of nonbloody vomiting yesterday. No weight loss. No change in bowel movements. Patient denies any alcohol use or NSAID use. Pain does not radiate to the back.  Patient complains of dysuria, urgency and frequency as well as bilateral flank pain. She has some nausea and vomiting with abdominal pain. She has had some chills but denies any subjective fever. No history of frequent urinary tract infection.  HPI  Past Medical History:  Diagnosis Date  . Asthma     There are no problems to display for this patient.   Past Surgical History:  Procedure Laterality Date  . TUBAL LIGATION      OB History   No obstetric history on file.      Home Medications    Prior to Admission medications   Medication Sig Start Date End Date Taking? Authorizing Provider  albuterol (VENTOLIN HFA) 108 (90 Base) MCG/ACT inhaler Inhale 2 puffs into the lungs every 4 (four) hours as needed for wheezing or shortness of breath. 12/21/19   Rawlin Reaume, Britta Mccreedy, MD  cephALEXin (KEFLEX) 500 MG capsule Take 1 capsule (500 mg total) by mouth 4 (four) times daily for 10 days. 12/21/19 12/31/19  Merrilee Jansky, MD  ondansetron (ZOFRAN ODT) 4 MG disintegrating tablet Take 1 tablet (4 mg total) by mouth every 8 (eight) hours as needed for nausea or vomiting. 12/21/19   Merrilee Jansky, MD  pantoprazole (PROTONIX) 20 MG tablet Take 1 tablet (20 mg total) by mouth daily. 12/21/19   Ariel Dimitri, Britta Mccreedy, MD  Spacer/Aero-Holding Chambers (AEROCHAMBER PLUS) inhaler Use as instructed 07/01/19    Domenick Gong, MD  sucralfate (CARAFATE) 1 g tablet Take 1 tablet (1 g total) by mouth 4 (four) times daily -  with meals and at bedtime for 14 days. 12/21/19 01/04/20  Merrilee Jansky, MD  fluticasone (FLONASE) 50 MCG/ACT nasal spray Place 2 sprays into both nostrils daily. 07/01/19 12/21/19  Domenick Gong, MD    Family History Family History  Problem Relation Age of Onset  . Hypertension Mother   . Hypertension Child     Social History Social History   Tobacco Use  . Smoking status: Never Smoker  . Smokeless tobacco: Never Used  Vaping Use  . Vaping Use: Never used  Substance Use Topics  . Alcohol use: Not Currently  . Drug use: Never     Allergies   Other   Review of Systems Review of Systems  Constitutional: Positive for chills. Negative for activity change, fatigue and fever.  Respiratory: Negative.   Gastrointestinal: Negative.   Genitourinary: Positive for dysuria, flank pain, frequency and urgency. Negative for vaginal discharge.  Musculoskeletal: Negative for arthralgias and joint swelling.  Skin: Negative.   Neurological: Negative for dizziness, light-headedness and headaches.     Physical Exam Triage Vital Signs ED Triage Vitals  Enc Vitals Group     BP 12/21/19 1454 (!) 111/57     Pulse Rate 12/21/19 1454 64     Resp 12/21/19 1454 18  Temp 12/21/19 1454 98 F (36.7 C)     Temp Source 12/21/19 1454 Oral     SpO2 12/21/19 1454 100 %     Weight 12/21/19 1450 149 lb 14.6 oz (68 kg)     Height 12/21/19 1450 5\' 2"  (1.575 m)     Head Circumference --      Peak Flow --      Pain Score 12/21/19 1449 10     Pain Loc --      Pain Edu? --      Excl. in GC? --    No data found.  Updated Vital Signs BP (!) 111/57 (BP Location: Left Arm)   Pulse 64   Temp 98 F (36.7 C) (Oral)   Resp 18   Ht 5\' 2"  (1.575 m)   Wt 68 kg   LMP 11/25/2019 (Approximate)   SpO2 100%   BMI 27.42 kg/m   Visual Acuity Right Eye Distance:   Left Eye Distance:     Bilateral Distance:    Right Eye Near:   Left Eye Near:    Bilateral Near:     Physical Exam Vitals and nursing note reviewed.  Constitutional:      General: She is not in acute distress.    Appearance: She is not ill-appearing.  Cardiovascular:     Rate and Rhythm: Normal rate and regular rhythm.     Heart sounds: Normal heart sounds.  Pulmonary:     Effort: Pulmonary effort is normal. No respiratory distress.     Breath sounds: No wheezing or rhonchi.  Abdominal:     General: Abdomen is flat. Bowel sounds are normal.     Palpations: Abdomen is soft.     Tenderness: There is abdominal tenderness in the epigastric area. There is right CVA tenderness and left CVA tenderness. There is no guarding or rebound.     Hernia: No hernia is present.  Neurological:     Mental Status: She is alert.      UC Treatments / Results  Labs (all labs ordered are listed, but only abnormal results are displayed) Labs Reviewed  URINALYSIS, COMPLETE (UACMP) WITH MICROSCOPIC - Abnormal; Notable for the following components:      Result Value   APPearance HAZY (*)    Hgb urine dipstick LARGE (*)    Protein, ur 30 (*)    Leukocytes,Ua MODERATE (*)    Bacteria, UA MANY (*)    All other components within normal limits  CBC WITH DIFFERENTIAL/PLATELET - Abnormal; Notable for the following components:   WBC 11.5 (*)    RBC 5.35 (*)    Neutro Abs 8.9 (*)    All other components within normal limits  URINE CULTURE  BASIC METABOLIC PANEL    EKG   Radiology No results found.  Procedures Procedures (including critical care time)  Medications Ordered in UC Medications  alum & mag hydroxide-simeth (MAALOX/MYLANTA) 200-200-20 MG/5ML suspension 30 mL (30 mLs Oral Given 12/21/19 1532)    And  lidocaine (XYLOCAINE) 2 % viscous mouth solution 15 mL (15 mLs Oral Given 12/21/19 1532)  ondansetron (ZOFRAN-ODT) disintegrating tablet 4 mg (4 mg Oral Given 12/21/19 1532)    Initial Impression /  Assessment and Plan / UC Course  I have reviewed the triage vital signs and the nursing notes.  Pertinent labs & imaging results that were available during my care of the patient were reviewed by me and considered in my medical decision making (see chart for details).  1. Acute gastritis without evidence of hemorrhage: CBC shows a WBC of 11.5 and hemoglobin of 14.5 BMP shows normal BUN GI cocktail given-decreased pain from 10-8 Patient will be started on Protonix 20 mg orally daily Carafate 1 g before meals and at bedtime Zofran as needed for nausea vomiting  2. Acute pyelonephritis: Urine is positive for WBC greater than 15, bacteria Keflex 500 mg 4 times daily for 10 days Urine culture sent Patient is advised to return to urgent care if her symptoms worsen. Work note given.  Final Clinical Impressions(s) / UC Diagnoses   Final diagnoses:  Acute superficial gastritis without hemorrhage  Acute pyelonephritis   Discharge Instructions   None    ED Prescriptions    Medication Sig Dispense Auth. Provider   albuterol (VENTOLIN HFA) 108 (90 Base) MCG/ACT inhaler Inhale 2 puffs into the lungs every 4 (four) hours as needed for wheezing or shortness of breath. 18 g Merrilee Jansky, MD   pantoprazole (PROTONIX) 20 MG tablet Take 1 tablet (20 mg total) by mouth daily. 30 tablet Shantoya Geurts, Britta Mccreedy, MD   sucralfate (CARAFATE) 1 g tablet Take 1 tablet (1 g total) by mouth 4 (four) times daily -  with meals and at bedtime for 14 days. 56 tablet Keshona Kartes, Britta Mccreedy, MD   ondansetron (ZOFRAN ODT) 4 MG disintegrating tablet Take 1 tablet (4 mg total) by mouth every 8 (eight) hours as needed for nausea or vomiting. 20 tablet Castulo Scarpelli, Britta Mccreedy, MD   cephALEXin (KEFLEX) 500 MG capsule Take 1 capsule (500 mg total) by mouth 4 (four) times daily for 10 days. 40 capsule Marieann Zipp, Britta Mccreedy, MD     PDMP not reviewed this encounter.   Merrilee Jansky, MD 12/21/19 [phone]

## 2019-12-21 NOTE — ED Triage Notes (Signed)
Pt c/o early satiety, upper abdominal pain, vomiting, dysuria, lower back pain and headache. Started about 2 week ago but has gotten worse of the last 2 days. She tried Tums, tylenol and ibuprofen.    She is alot requesting a refill on her albuterol inhaler.

## 2019-12-24 LAB — URINE CULTURE: Culture: >=100000 — AB

## 2020-10-15 ENCOUNTER — Encounter: Payer: Self-pay | Admitting: Emergency Medicine

## 2020-10-15 ENCOUNTER — Ambulatory Visit
Admission: EM | Admit: 2020-10-15 | Discharge: 2020-10-15 | Disposition: A | Discharge status: Home / Self Care | Payer: BC Managed Care – PPO | Attending: Sports Medicine | Admitting: Sports Medicine

## 2020-10-15 ENCOUNTER — Other Ambulatory Visit: Payer: Self-pay

## 2020-10-15 DIAGNOSIS — R0602 Shortness of breath: Secondary | ICD-10-CM | POA: Diagnosis not present

## 2020-10-15 DIAGNOSIS — R0789 Other chest pain: Secondary | ICD-10-CM

## 2020-10-15 DIAGNOSIS — Z760 Encounter for issue of repeat prescription: Secondary | ICD-10-CM

## 2020-10-15 DIAGNOSIS — R062 Wheezing: Secondary | ICD-10-CM

## 2020-10-15 DIAGNOSIS — R059 Cough, unspecified: Secondary | ICD-10-CM

## 2020-10-15 DIAGNOSIS — J4520 Mild intermittent asthma, uncomplicated: Secondary | ICD-10-CM

## 2020-10-15 MED ORDER — ALBUTEROL SULFATE HFA 108 (90 BASE) MCG/ACT IN AERS: 1.0000 | INHALATION_SPRAY | RESPIRATORY_TRACT | 0 refills | Status: DC | PRN

## 2020-10-15 MED ORDER — PREDNISONE 10 MG (21) PO TBPK: ORAL_TABLET | Freq: Every day | ORAL | 0 refills | Status: DC

## 2020-10-15 NOTE — ED Provider Notes (Signed)
[facility]    CSN: [id number] Arrival date & time: 10/15/20  1619      History   Chief Complaint Chief Complaint  Patient presents with  . Asthma  . Medication Refill    HPI Hailey Clark is a 45 y.o. female.   Patient is a pleasant 45 year old female who presents for evaluation of the above issue.  She does not have a primary care provider.  She says she comes to the urgent care when she needs refills on her asthma medications.  She ran out of her albuterol about 1 week ago.  Given the high pollen count she has had chest tightness, cough, increased shortness of breath.  She is also noted a mild wheeze.  She does have a history of mild intermittent asthma.  She works as a Furniture conservator/restorer.  No chest pain noted.  No red flag signs or symptoms appreciated on history.     Past Medical History:  Diagnosis Date  . Asthma     There are no problems to display for this patient.   Past Surgical History:  Procedure Laterality Date  . TUBAL LIGATION      OB History   No obstetric history on file.      Home Medications    Prior to Admission medications   Medication Sig Start Date End Date Taking? Authorizing Provider  predniSONE (STERAPRED UNI-PAK 21 TAB) 10 MG (21) TBPK tablet Take by mouth daily. Take 6 tabs by mouth daily  for 2 days, then 5 tabs for 2 days, then 4 tabs for 2 days, then 3 tabs for 2 days, 2 tabs for 2 days, then 1 tab by mouth daily for 2 days 10/15/20  Yes Delton See, MD  albuterol (VENTOLIN HFA) 108 (90 Base) MCG/ACT inhaler Inhale 1-2 puffs into the lungs every 4 (four) hours as needed for wheezing or shortness of breath. 10/15/20   Delton See, MD  ondansetron (ZOFRAN ODT) 4 MG disintegrating tablet Take 1 tablet (4 mg total) by mouth every 8 (eight) hours as needed for nausea or vomiting. 12/21/19   Merrilee Jansky, MD  pantoprazole (PROTONIX) 20 MG tablet Take 1 tablet (20 mg total) by mouth daily. 12/21/19   Lamptey, Britta Mccreedy,  MD  Spacer/Aero-Holding Chambers (AEROCHAMBER PLUS) inhaler Use as instructed 07/01/19   Domenick Gong, MD  sucralfate (CARAFATE) 1 g tablet Take 1 tablet (1 g total) by mouth 4 (four) times daily -  with meals and at bedtime for 14 days. 12/21/19 01/04/20  Merrilee Jansky, MD  fluticasone (FLONASE) 50 MCG/ACT nasal spray Place 2 sprays into both nostrils daily. 07/01/19 12/21/19  Domenick Gong, MD    Family History Family History  Problem Relation Age of Onset  . Hypertension Mother   . Hypertension Child     Social History Social History   Tobacco Use  . Smoking status: Never Smoker  . Smokeless tobacco: Never Used  Vaping Use  . Vaping Use: Never used  Substance Use Topics  . Alcohol use: Not Currently  . Drug use: Never     Allergies   Other   Review of Systems Review of Systems  Constitutional: Negative.  Negative for activity change, appetite change, chills, diaphoresis, fatigue and fever.  HENT: Negative.  Negative for congestion, ear pain, postnasal drip, rhinorrhea, sinus pain, sneezing and sore throat.   Eyes: Negative.  Negative for pain.  Respiratory: Positive for cough, chest tightness, shortness of breath and wheezing.  Cardiovascular: Negative.  Negative for chest pain, palpitations and leg swelling.  Gastrointestinal: Negative.  Negative for abdominal pain, diarrhea, nausea and vomiting.  Genitourinary: Negative.  Negative for dysuria.  Musculoskeletal: Negative.  Negative for myalgias.  Skin: Negative.  Negative for color change, pallor, rash and wound.  Neurological: Negative.  Negative for dizziness, seizures, weakness, light-headedness and headaches.  Hematological: Negative.   All other systems reviewed and are negative.    Physical Exam Triage Vital Signs ED Triage Vitals  Enc Vitals Group     BP 10/15/20 1630 109/76     Pulse Rate 10/15/20 1630 68     Resp 10/15/20 1630 18     Temp 10/15/20 1630 98.9 F (37.2 C)     Temp Source  10/15/20 1630 Oral     SpO2 10/15/20 1630 98 %     Weight 10/15/20 1628 149 lb 14.6 oz (68 kg)     Height 10/15/20 1628 5\' 2"  (1.575 m)     Head Circumference --      Peak Flow --      Pain Score 10/15/20 1628 0     Pain Loc --      Pain Edu? --      Excl. in GC? --    No data found.  Updated Vital Signs BP 109/76 (BP Location: Left Arm)   Pulse 68   Temp 98.9 F (37.2 C) (Oral)   Resp 18   Ht 5\' 2"  (1.575 m)   Wt 68 kg   LMP 10/01/2020   SpO2 98%   BMI 27.42 kg/m   Visual Acuity Right Eye Distance:   Left Eye Distance:   Bilateral Distance:    Right Eye Near:   Left Eye Near:    Bilateral Near:     Physical Exam Vitals and nursing note reviewed.  Constitutional:      General: She is not in acute distress.    Appearance: Normal appearance. She is not ill-appearing, toxic-appearing or diaphoretic.  HENT:     Head: Normocephalic and atraumatic.     Right Ear: Tympanic membrane normal.     Left Ear: Tympanic membrane normal.     Nose: Nose normal.     Mouth/Throat:     Mouth: Mucous membranes are moist.     Pharynx: No oropharyngeal exudate or posterior oropharyngeal erythema.  Eyes:     Extraocular Movements: Extraocular movements intact.     Conjunctiva/sclera: Conjunctivae normal.     Pupils: Pupils are equal, round, and reactive to light.  Cardiovascular:     Rate and Rhythm: Normal rate and regular rhythm.     Pulses: Normal pulses.     Heart sounds: Normal heart sounds. No murmur heard. No friction rub. No gallop.   Pulmonary:     Effort: Pulmonary effort is normal. No accessory muscle usage or respiratory distress.     Breath sounds: No stridor or decreased air movement. Examination of the right-upper field reveals wheezing. Examination of the left-upper field reveals wheezing. Examination of the right-middle field reveals wheezing. Examination of the left-middle field reveals wheezing. Examination of the right-lower field reveals wheezing. Examination  of the left-lower field reveals wheezing. Wheezing present. No decreased breath sounds, rhonchi or rales.     Comments: Very mild wheeze appreciated in all lung fields. Musculoskeletal:     Cervical back: Normal range of motion and neck supple. No rigidity or tenderness.  Lymphadenopathy:     Cervical: No cervical adenopathy.  Skin:  General: Skin is warm and dry.     Capillary Refill: Capillary refill takes less than 2 seconds.     Coloration: Skin is not jaundiced.     Findings: No bruising, erythema, lesion or rash.  Neurological:     General: No focal deficit present.     Mental Status: She is alert and oriented to person, place, and time.      UC Treatments / Results  Labs (all labs ordered are listed, but only abnormal results are displayed) Labs Reviewed - No data to display  EKG   Radiology No results found.  Procedures Procedures (including critical care time)  Medications Ordered in UC Medications - No data to display  Initial Impression / Assessment and Plan / UC Course  I have reviewed the triage vital signs and the nursing notes.  Pertinent labs & imaging results that were available during my care of the patient were reviewed by me and considered in my medical decision making (see chart for details).  Clinical impression: Pleasant 45 year old with a history of asthma.  She has run out of her albuterol inhaler.  She does have chest tightness, wheeze, shortness of breath, and her symptoms have worsened since the increased pollen count recently.  Treatment plan: 1.  The findings and treatment plan were discussed in detail with the patient.  Patient was in agreement. 2.  I renewed her albuterol inhaler. 3.  Given her wheeze and her shortness of breath with chest tightness I gave her a prednisone taper. 4.  Put in a request to assist her with establishing care to have a primary care provider so she does not keep coming to the urgent care for medication  refills.  I had a long discussion with her regarding the importance to establish care.  She will also look on the back of her insurance card and find providers locally that can meet her needs. 5.  Educational handouts provided. 6.  I offered a work note she deferred. 7.  If her symptoms worsen then she is welcome to come back here before she is able to establish care with a PCP.  Alternatively if they are really bad she should go to the ER. 8.  Discharge from care at this time and she was stable on discharge.    Final Clinical Impressions(s) / UC Diagnoses   Final diagnoses:  Encounter for medication refill  Shortness of breath  Chest tightness  Cough  Mild intermittent asthma, unspecified whether complicated  Wheeze     Discharge Instructions     Please see educational handouts    ED Prescriptions    Medication Sig Dispense Auth. Provider   albuterol (VENTOLIN HFA) 108 (90 Base) MCG/ACT inhaler Inhale 1-2 puffs into the lungs every 4 (four) hours as needed for wheezing or shortness of breath. 18 g Delton See, MD   predniSONE (STERAPRED UNI-PAK 21 TAB) 10 MG (21) TBPK tablet Take by mouth daily. Take 6 tabs by mouth daily  for 2 days, then 5 tabs for 2 days, then 4 tabs for 2 days, then 3 tabs for 2 days, 2 tabs for 2 days, then 1 tab by mouth daily for 2 days 42 tablet Delton See, MD     PDMP not reviewed this encounter.   Delton See, MD 10/18/20 [phone]

## 2020-10-15 NOTE — Discharge Instructions (Signed)
Please see educational handouts 

## 2020-10-15 NOTE — ED Triage Notes (Signed)
Patient c/o history of asthma and states she ran out of her Albuterol inhaler x 1 week ago. She states for the last week that she has not had her inhaler her asthma has worsened. She is requesting a refill on her Albuterol inhaler and Albuterol nebulizer solution.

## 2021-01-30 ENCOUNTER — Ambulatory Visit: Payer: Self-pay | Admitting: Family Medicine

## 2021-02-04 ENCOUNTER — Ambulatory Visit: Payer: Self-pay | Admitting: Family Medicine

## 2022-09-12 ENCOUNTER — Ambulatory Visit
Admission: EM | Admit: 2022-09-12 | Discharge: 2022-09-12 | Disposition: A | Discharge status: Home / Self Care | Payer: Self-pay | Attending: Family Medicine | Admitting: Family Medicine

## 2022-09-12 ENCOUNTER — Encounter: Payer: Self-pay | Admitting: Emergency Medicine

## 2022-09-12 DIAGNOSIS — J45901 Unspecified asthma with (acute) exacerbation: Secondary | ICD-10-CM

## 2022-09-12 MED ORDER — METHYLPREDNISOLONE ACETATE 80 MG/ML IJ SUSP
80.0000 mg | Freq: Once | INTRAMUSCULAR | Status: AC
  Administered 2022-09-12: 80 mg via INTRAMUSCULAR

## 2022-09-12 MED ORDER — PREDNISONE 10 MG PO TABS: ORAL_TABLET | ORAL | 0 refills | Status: DC

## 2022-09-12 MED ORDER — METHYLPREDNISOLONE SODIUM SUCC 40 MG IJ SOLR: 80.0000 mg | Freq: Once | INTRAMUSCULAR | Status: DC

## 2022-09-12 MED ORDER — ALBUTEROL SULFATE (2.5 MG/3ML) 0.083% IN NEBU: 2.5000 mg | INHALATION_SOLUTION | Freq: Four times a day (QID) | RESPIRATORY_TRACT | 12 refills | Status: DC | PRN

## 2022-09-12 MED ORDER — ALBUTEROL SULFATE HFA 108 (90 BASE) MCG/ACT IN AERS: 1.0000 | INHALATION_SPRAY | RESPIRATORY_TRACT | 6 refills | Status: DC | PRN

## 2022-09-12 MED ORDER — IPRATROPIUM-ALBUTEROL 0.5-2.5 (3) MG/3ML IN SOLN
3.0000 mL | Freq: Once | RESPIRATORY_TRACT | Status: AC
  Administered 2022-09-12: 3 mL via RESPIRATORY_TRACT

## 2022-09-12 NOTE — ED Triage Notes (Signed)
Patient states that she has history of asthma.  Patient reports that she started having SOB yesterday.  Patient states that she does not have any asthma medications. Patient c/o cough and congestion for 2 days.

## 2022-09-12 NOTE — ED Provider Notes (Signed)
[facility]    CSN: [id number] Arrival date & time: 09/12/22  [id number]  History   Chief Complaint Chief Complaint  Patient presents with   Shortness of Breath   Asthma    HPI  47 year old  female with asthma presents with wheezing and shortness of breath.  Patient reports that she has asthma.  Developed shortness of breath and wheezing yesterday.  She does not have any of her inhalers or nebulizer medication at home.  She has had some cough and congestion as well.  No fever.  Past Medical History:  Diagnosis Date   Asthma    Past Surgical History:  Procedure Laterality Date   TUBAL LIGATION      OB History   No obstetric history on file.      Home Medications    Prior to Admission medications   Medication Sig Start Date End Date Taking? Authorizing Provider  albuterol (PROVENTIL) (2.5 MG/3ML) 0.083% nebulizer solution Take 3 mLs (2.5 mg total) by nebulization every 6 (six) hours as needed for wheezing or shortness of breath. 09/12/22  Yes Willis Holquin G, DO  predniSONE (DELTASONE) 10 MG tablet 50 mg daily x 2 days, then 40 mg daily x 2 days, then 30 mg daily x 2 days, then 20 mg daily x 2 days, then 10 mg daily x 2 days. 09/12/22  Yes Nilza Eaker G, DO  albuterol (VENTOLIN HFA) 108 (90 Base) MCG/ACT inhaler Inhale 1-2 puffs into the lungs every 4 (four) hours as needed for wheezing or shortness of breath. 09/12/22   Coral Spikes, DO  fluticasone (FLONASE) 50 MCG/ACT nasal spray Place 2 sprays into both nostrils daily. 07/01/19 12/21/19  Melynda Ripple, MD    Family History Family History  Problem Relation Age of Onset   Hypertension Mother    Hypertension Child     Social History Social History   Tobacco Use   Smoking status: Never   Smokeless tobacco: Never  Vaping Use   Vaping Use: Never used  Substance Use Topics   Alcohol use: Not Currently   Drug use: Never     Allergies   Other   Review of Systems Review of Systems  Respiratory:   Positive for shortness of breath and wheezing.    Physical Exam Triage Vital Signs ED Triage Vitals  Enc Vitals Group     BP 09/12/22 0955 120/70     Pulse Rate 09/12/22 0955 80     Resp 09/12/22 0955 (!) 22     Temp 09/12/22 0955 98.2 F (36.8 C)     Temp Source 09/12/22 0955 Oral     SpO2 09/12/22 0955 99 %     Weight 09/12/22 0954 160 lb (72.6 kg)     Height 09/12/22 0954 5\' 2"  (1.575 m)     Head Circumference --      Peak Flow --      Pain Score 09/12/22 0953 10     Pain Loc --      Pain Edu? --      Excl. in [place]? --     Updated Vital Signs BP 120/70 (BP Location: Left Arm)   Pulse 80   Temp 98.2 F (36.8 C) (Oral)   Resp (!) 22   Ht 5\' 2"  (1.575 m)   Wt 72.6 kg   LMP 08/29/2022 (Approximate)   SpO2 99%   BMI 29.26 kg/m   Visual Acuity Right Eye Distance:   Left Eye Distance:  Bilateral Distance:    Right Eye Near:   Left Eye Near:    Bilateral Near:     Physical Exam Vitals and nursing note reviewed.  Constitutional:      Comments: Mild increased work of breathing.   HENT:     Head: Normocephalic and atraumatic.  Eyes:     General:        Right eye: No discharge.        Left eye: No discharge.     Conjunctiva/sclera: Conjunctivae normal.  Cardiovascular:     Rate and Rhythm: Normal rate and regular rhythm.  Pulmonary:     Comments: Mild increased work of breathing.  Diffuse expiratory wheezing. Neurological:     Mental Status: She is alert.  Psychiatric:        Mood and Affect: Mood normal.        Behavior: Behavior normal.     UC Treatments / Results  Labs (all labs ordered are listed, but only abnormal results are displayed) Labs Reviewed - No data to display  EKG   Radiology No results found.  Procedures Procedures (including critical care time)  Medications Ordered in UC Medications  ipratropium-albuterol (DUONEB) 0.5-2.5 (3) MG/3ML nebulizer solution 3 mL (3 mLs Nebulization Given 09/12/22 1007)  methylPREDNISolone acetate  (DEPO-MEDROL) injection 80 mg (80 mg Intramuscular Given 09/12/22 1040)    Initial Impression / Assessment and Plan / UC Course  I have reviewed the triage vital signs and the nursing notes.  Pertinent labs & imaging results that were available during my care of the patient were reviewed by me and considered in my medical decision making (see chart for details).    47 year old female presents with an asthma exacerbation.  DuoNeb was given with improvement.  Patient was also given IM Depo-Medrol.  Discharged home with albuterol and prednisone.  Final Clinical Impressions(s) / UC Diagnoses   Final diagnoses:  Exacerbation of asthma, unspecified asthma severity, unspecified whether persistent   Discharge Instructions   None    ED Prescriptions     Medication Sig Dispense Auth. Provider   albuterol (VENTOLIN HFA) 108 (90 Base) MCG/ACT inhaler Inhale 1-2 puffs into the lungs every 4 (four) hours as needed for wheezing or shortness of breath. 18 g Jaime Dome G, DO   albuterol (PROVENTIL) (2.5 MG/3ML) 0.083% nebulizer solution Take 3 mLs (2.5 mg total) by nebulization every 6 (six) hours as needed for wheezing or shortness of breath. 75 mL Jakai Risse G, DO   predniSONE (DELTASONE) 10 MG tablet 50 mg daily x 2 days, then 40 mg daily x 2 days, then 30 mg daily x 2 days, then 20 mg daily x 2 days, then 10 mg daily x 2 days. 30 tablet Coral Spikes, DO      PDMP not reviewed this encounter.   Coral Spikes, [place] 09/12/22 1138

## 2023-05-01 ENCOUNTER — Other Ambulatory Visit: Payer: Self-pay | Admitting: Family Medicine

## 2023-05-10 ENCOUNTER — Ambulatory Visit
Admission: EM | Admit: 2023-05-10 | Discharge: 2023-05-10 | Disposition: A | Discharge status: Home / Self Care | Payer: Self-pay | Attending: Internal Medicine | Admitting: Internal Medicine

## 2023-05-10 ENCOUNTER — Ambulatory Visit: Payer: Self-pay

## 2023-05-10 ENCOUNTER — Encounter: Payer: Self-pay | Admitting: Emergency Medicine

## 2023-05-10 DIAGNOSIS — J4541 Moderate persistent asthma with (acute) exacerbation: Secondary | ICD-10-CM

## 2023-05-10 DIAGNOSIS — R062 Wheezing: Secondary | ICD-10-CM

## 2023-05-10 DIAGNOSIS — R0602 Shortness of breath: Secondary | ICD-10-CM

## 2023-05-10 MED ORDER — METHYLPREDNISOLONE ACETATE 80 MG/ML IJ SUSP
40.0000 mg | Freq: Once | INTRAMUSCULAR | Status: AC
  Administered 2023-05-10: 40 mg via INTRAMUSCULAR

## 2023-05-10 MED ORDER — IPRATROPIUM-ALBUTEROL 0.5-2.5 (3) MG/3ML IN SOLN
3.0000 mL | Freq: Once | RESPIRATORY_TRACT | Status: AC
Start: 2023-05-10 — End: 2023-05-10
  Administered 2023-05-10: 3 mL via RESPIRATORY_TRACT

## 2023-05-10 MED ORDER — ALBUTEROL SULFATE HFA 108 (90 BASE) MCG/ACT IN AERS: 1.0000 | INHALATION_SPRAY | Freq: Four times a day (QID) | RESPIRATORY_TRACT | 0 refills | Status: DC | PRN

## 2023-05-10 MED ORDER — METHYLPREDNISOLONE SODIUM SUCC 40 MG IJ SOLR: 40.0000 mg | Freq: Once | INTRAMUSCULAR | Status: DC

## 2023-05-10 MED ORDER — METHYLPREDNISOLONE ACETATE 40 MG/ML IJ SUSP: 40.0000 mg | Freq: Once | INTRAMUSCULAR | Status: DC

## 2023-05-10 MED ORDER — PREDNISONE 10 MG (21) PO TBPK: ORAL_TABLET | Freq: Every day | ORAL | 0 refills | Status: DC

## 2023-05-10 NOTE — ED Provider Notes (Signed)
[facility]    CSN: [id number] Arrival date & time: 05/10/23  1506      History   Chief Complaint Chief Complaint  Patient presents with   Shortness of Breath    HPI Hailey Clark is a 47 y.o. female presents for asthma exam patient.  Patient reports 3 weeks of a persistent shortness of breath with wheezing that occurs primarily with talking, activity, eating.  She does have a history of asthma and states she has been using her albuterol inhaler or home nebulizer at least 12 times a day.  She is not on a steroid inhaler.  Denies any URI/cold symptoms.  No fevers.  She does not have a PCP or pulmonologist.  She has [facility].  States this is the third time she has had to be seen for similar symptoms.  Last use of her inhaler was 6 hours ago.  No other concerns at this time.   Shortness of Breath Associated symptoms: wheezing     Past Medical History:  Diagnosis Date   Asthma     There are no problems to display for this patient.   Past Surgical History:  Procedure Laterality Date   TUBAL LIGATION      OB History   No obstetric history on file.      Home Medications    Prior to Admission medications   Medication Sig Start Date End Date Taking? Authorizing Provider  albuterol (VENTOLIN HFA) 108 (90 Base) MCG/ACT inhaler Inhale 1-2 puffs into the lungs every 6 (six) hours as needed for wheezing or shortness of breath. 05/10/23  Yes Radford Pax, NP  predniSONE (STERAPRED UNI-PAK 21 TAB) 10 MG (21) TBPK tablet Take by mouth daily. Take 6 tabs by mouth daily  for 2 days, then 5 tabs for 2 days, then 4 tabs for 2 days, then 3 tabs for 2 days, 2 tabs for 2 days, then 1 tab by mouth daily for 2 days 05/11/23  Yes Radford Pax, NP  fluticasone (FLONASE) 50 MCG/ACT nasal spray Place 2 sprays into both nostrils daily. 07/01/19 12/21/19  Domenick Gong, MD    Family History Family History  Problem Relation Age of Onset    Hypertension Mother    Hypertension Child     Social History Social History   Tobacco Use   Smoking status: Never   Smokeless tobacco: Never  Vaping Use   Vaping status: Never Used  Substance Use Topics   Alcohol use: Not Currently   Drug use: Never     Allergies   Other   Review of Systems Review of Systems  Respiratory:  Positive for shortness of breath and wheezing.      Physical Exam Triage Vital Signs ED Triage Vitals  Encounter Vitals Group     BP 05/10/23 1538 114/61     Systolic BP Percentile --      Diastolic BP Percentile --      Pulse Rate 05/10/23 1538 65     Resp 05/10/23 1538 19     Temp 05/10/23 1538 98.1 F (36.7 C)     Temp Source 05/10/23 1538 Oral     SpO2 05/10/23 1538 100 %     Weight --      Height --      Head Circumference --      Peak Flow --      Pain Score 05/10/23 1536 8     Pain  Loc --      Pain Education --      Exclude from Growth Chart --    No data found.  Updated Vital Signs BP 114/61 (BP Location: Left Arm)   Pulse 65   Temp 98.1 F (36.7 C) (Oral)   Resp 19   LMP 05/03/2023   SpO2 100%   Visual Acuity Right Eye Distance:   Left Eye Distance:   Bilateral Distance:    Right Eye Near:   Left Eye Near:    Bilateral Near:     Physical Exam Vitals and nursing note reviewed.  Constitutional:      General: She is not in acute distress.    Appearance: Normal appearance. She is not ill-appearing, toxic-appearing or diaphoretic.     Comments: Patient does get short of breath with talking  HENT:     Head: Normocephalic and atraumatic.  Eyes:     Pupils: Pupils are equal, round, and reactive to light.  Cardiovascular:     Rate and Rhythm: Normal rate and regular rhythm.     Heart sounds: Normal heart sounds.  Pulmonary:     Effort: Pulmonary effort is normal.     Breath sounds: Wheezing present.  Skin:    General: Skin is warm and dry.  Neurological:     General: No focal deficit present.     Mental  Status: She is alert and oriented to person, place, and time.  Psychiatric:        Mood and Affect: Mood normal.        Behavior: Behavior normal.      UC Treatments / Results  Labs (all labs ordered are listed, but only abnormal results are displayed) Labs Reviewed - No data to display  EKG   Radiology No results found.  Procedures Procedures (including critical care time)  Medications Ordered in UC Medications  ipratropium-albuterol (DUONEB) 0.5-2.5 (3) MG/3ML nebulizer solution 3 mL (3 mLs Nebulization Given 05/10/23 1636)  methylPREDNISolone acetate (DEPO-MEDROL) injection 40 mg (40 mg Intramuscular Given 05/10/23 1634)    Initial Impression / Assessment and Plan / UC Course  I have reviewed the triage vital signs and the nursing notes.  Pertinent labs & imaging results that were available during my care of the patient were reviewed by me and considered in my medical decision making (see chart for details).     Reviewed exam and symptoms with patient.  No red flags.  Patient with significant improvement in symptoms/wheezing after nebulizer.  Discussed asthma exacerbation.  Refilled albuterol inhaler and will start prednisone taper tomorrow.  Patient to establish with pulmonology for further workup.  Contact information provided.  Strict ER precautions reviewed and patient verbalized understanding. Final Clinical Impressions(s) / UC Diagnoses   Final diagnoses:  Shortness of breath  Wheezing  Moderate persistent asthma with acute exacerbation     Discharge Instructions      Albuterol inhaler has been refilled.  Start prednisone taper tomorrow, 11/26. Please get an appointment with [facility] health pulmonology as soon as possible for further treatment options of your asthma.  Please go to the ER for any worsening symptoms.  Hope you feel better soon!     ED Prescriptions     Medication Sig Dispense Auth. Provider   albuterol (VENTOLIN HFA) 108 (90 Base) MCG/ACT  inhaler Inhale 1-2 puffs into the lungs every 6 (six) hours as needed for wheezing or shortness of breath. 1 each Radford Pax, NP   predniSONE (STERAPRED Carlus Pavlov  21 TAB) 10 MG (21) TBPK tablet Take by mouth daily. Take 6 tabs by mouth daily  for 2 days, then 5 tabs for 2 days, then 4 tabs for 2 days, then 3 tabs for 2 days, 2 tabs for 2 days, then 1 tab by mouth daily for 2 days 42 tablet Radford Pax, NP      PDMP not reviewed this encounter.   Radford Pax, NP 05/10/23 [phone]

## 2023-05-10 NOTE — ED Triage Notes (Signed)
Pt presents with SOB x 3 weeks. Pt states she can't eat, talk or walk long distance without getting breathless. She has to use her inhaler and albuterol treatment frequently. Pt states she has been seen at least 3 times this year for these symptoms. She will be treated and she would feel good for a few day and then her symptoms will return.

## 2023-05-10 NOTE — Discharge Instructions (Addendum)
Albuterol inhaler has been refilled.  Start prednisone taper tomorrow, 11/26. Please get an appointment with [facility] health pulmonology as soon as possible for further treatment options of your asthma.  Please go to the ER for any worsening symptoms.  Hope you feel better soon!

## 2023-06-02 ENCOUNTER — Ambulatory Visit
Admission: RE | Admit: 2023-06-02 | Discharge: 2023-06-02 | Disposition: A | Discharge status: Home / Self Care | Payer: Self-pay | Attending: Student in an Organized Health Care Education/Training Program | Admitting: Student in an Organized Health Care Education/Training Program

## 2023-06-02 ENCOUNTER — Ambulatory Visit
Admission: RE | Admit: 2023-06-02 | Discharge: 2023-06-02 | Disposition: A | Discharge status: Home / Self Care | Payer: Self-pay | Source: Ambulatory Visit | Attending: Student in an Organized Health Care Education/Training Program | Admitting: Student in an Organized Health Care Education/Training Program

## 2023-06-02 ENCOUNTER — Ambulatory Visit (INDEPENDENT_AMBULATORY_CARE_PROVIDER_SITE_OTHER): Payer: Self-pay | Admitting: Student in an Organized Health Care Education/Training Program

## 2023-06-02 ENCOUNTER — Other Ambulatory Visit
Admission: RE | Admit: 2023-06-02 | Discharge: 2023-06-02 | Disposition: A | Discharge status: Home / Self Care | Payer: Self-pay | Source: Home / Self Care | Attending: Student in an Organized Health Care Education/Training Program | Admitting: Student in an Organized Health Care Education/Training Program

## 2023-06-02 ENCOUNTER — Encounter: Payer: Self-pay | Admitting: Student in an Organized Health Care Education/Training Program

## 2023-06-02 VITALS — BP 108/72 | HR 66 | Temp 97.8°F | Ht 62.0 in | Wt 180.0 lb

## 2023-06-02 DIAGNOSIS — J4550 Severe persistent asthma, uncomplicated: Secondary | ICD-10-CM

## 2023-06-02 DIAGNOSIS — R0602 Shortness of breath: Secondary | ICD-10-CM

## 2023-06-02 LAB — CBC WITH DIFFERENTIAL/PLATELET
Abs Immature Granulocytes: 0.01 10*3/uL (ref 0.00–0.07)
Basophils Absolute: 0.1 10*3/uL (ref 0.0–0.1)
Basophils Relative: 1 %
Eosinophils Absolute: 0.1 10*3/uL (ref 0.0–0.5)
Eosinophils Relative: 1 %
HCT: 45.1 % (ref 36.0–46.0)
Hemoglobin: 14.4 g/dL (ref 12.0–15.0)
Immature Granulocytes: 0 %
Lymphocytes Relative: 32 %
Lymphs Abs: 2.1 10*3/uL (ref 0.7–4.0)
MCH: 25.8 pg — ABNORMAL LOW (ref 26.0–34.0)
MCHC: 31.9 g/dL (ref 30.0–36.0)
MCV: 80.7 fL (ref 80.0–100.0)
Monocytes Absolute: 0.6 10*3/uL (ref 0.1–1.0)
Monocytes Relative: 10 %
Neutro Abs: 3.6 10*3/uL (ref 1.7–7.7)
Neutrophils Relative %: 56 %
Platelets: 330 10*3/uL (ref 150–400)
RBC: 5.59 MIL/uL — ABNORMAL HIGH (ref 3.87–5.11)
RDW: 14.3 % (ref 11.5–15.5)
WBC: 6.4 10*3/uL (ref 4.0–10.5)
nRBC: 0 % (ref 0.0–0.2)

## 2023-06-02 LAB — NITRIC OXIDE: Nitric Oxide: 51

## 2023-06-02 MED ORDER — FLUTICASONE-SALMETEROL 230-21 MCG/ACT IN AERO: 2.0000 | INHALATION_SPRAY | Freq: Two times a day (BID) | RESPIRATORY_TRACT | 12 refills | Status: DC

## 2023-06-02 MED ORDER — AEROCHAMBER MV MISC: 0 refills | Status: AC

## 2023-06-02 MED ORDER — AIRSUPRA 90-80 MCG/ACT IN AERO: 2.0000 | INHALATION_SPRAY | RESPIRATORY_TRACT | Status: DC | PRN

## 2023-06-02 NOTE — Progress Notes (Signed)
Assessment & Plan:   #Severe persistent asthma  Presents with shortness of breath, cough and wheeze which is highly concerning for uncontrolled severe persistent asthma. FENO is elevated at 51 ppb in clinic today. For management, we I will initiate ICS/LABA at highest dose today, and provide the patient with SABA/ICS inhaler as a sample to be used in addition to advair until symptoms are fully controlled. She is paying out of pocket so will hold off on sending SABA/ICS prescription for now. Will obtain PFT's, an allergen panel, a CXR, and CBC with differential for further workup of her asthma. Follow up in 3 months to re-assess symptoms. If symptoms persist might consider high resolution CT to rule out ILD as well as EDAC (though I suspect this less).  - fluticasone-salmeterol (ADVAIR HFA) 230-21 MCG/ACT inhaler; Inhale 2 puffs into the lungs 2 (two) times daily.  Dispense: 1 each; Refill: 12 - Spacer/Aero-Holding Chambers (AEROCHAMBER MV) inhaler; Use as instructed  Dispense: 1 each; Refill: 0 - DG Chest 2 View; Future > reviewed, no focal infiltrates - Allergen Panel (27) + IGE; Future - CBC w/Diff; Future - Pulmonary Function Test [facility] Only; Future - FENO elevated at 51 ppb   Return in about 3 months (around 08/31/2023).  I spent 62 minutes caring for this patient today, including preparing to see the patient, obtaining a medical history , reviewing a separately obtained history, performing a medically appropriate examination and/or evaluation, counseling and educating the patient/family/caregiver, ordering medications, tests, or procedures, documenting clinical information in the electronic health record, and independently interpreting results (not separately reported/billed) and communicating results to the patient/family/caregiver  Raechel Chute, MD [facility]  End of visit medications:  Meds ordered this encounter  Medications   fluticasone-salmeterol  (ADVAIR HFA) 230-21 MCG/ACT inhaler    Sig: Inhale 2 puffs into the lungs 2 (two) times daily.    Dispense:  1 each    Refill:  12   Spacer/Aero-Holding Chambers (AEROCHAMBER MV) inhaler    Sig: Use as instructed    Dispense:  1 each    Refill:  0     Current Outpatient Medications:    albuterol (VENTOLIN HFA) 108 (90 Base) MCG/ACT inhaler, Inhale 1-2 puffs into the lungs every 6 (six) hours as needed for wheezing or shortness of breath., Disp: 1 each, Rfl: 0   Albuterol Sulfate 2.5 MG/0.5ML NEBU, Inhale into the lungs every 4 (four) hours as needed., Disp: , Rfl:    fluticasone-salmeterol (ADVAIR HFA) 230-21 MCG/ACT inhaler, Inhale 2 puffs into the lungs 2 (two) times daily., Disp: 1 each, Rfl: 12   Spacer/Aero-Holding Chambers (AEROCHAMBER MV) inhaler, Use as instructed, Disp: 1 each, Rfl: 0   Subjective:   PATIENT ID: Hailey Clark GENDER: female DOB: 01/17/1976, MRN: [id number]  Chief Complaint  Patient presents with   Consult    Recurrent asthma attacks since April. Has been to ED 3 times in the last 8 months. Shortness of breath on exertion and after eating. Wheezing. No cough.     HPI  Patient is a pleasant 47 year old female with a past medical history of asthma who presents to clinic for evaluation of shortness of breath.  She reports having an asthma since her childhood but felt that she outgrew it at some point.  Around 8 months ago, she developed an asthma exacerbation requiring presentation to urgent care where she was prescribed albuterol and prednisone.  She had another asthma exacerbation by the end of November of  2024 again requiring course of prednisone with some improvement but not full resolution. She feels that she is short of breath with exertion and reports an occasional cough.  She feels chest tightness on taking deep breaths.  She has a wheeze.  She is using her albuterol inhaler multiple times a day.  She does wake up at night to use her albuterol inhaler.   She reports multiple allergies in the past and having seen an allergist.  Patient denies any fevers, chills, night sweats, or weight loss.  She actually reports weight gain since having to use prednisone frequently.  She does not have any signs or symptoms of an upper respiratory tract infection or lower respiratory tract infection.  No sputum production reported, and no hemoptysis.  Patient is originally from [place] 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 She sometimes has to go into houses to help with the work. She is not sure if certain houses make her symptoms worse. She is a non-smoker.  Ancillary information including prior medications, full medical/surgical/family/social histories, and PFTs (when available) are listed below and have been reviewed.   Review of Systems  Constitutional:  Negative for chills, fever and weight loss.  Respiratory:  Positive for cough, sputum production, shortness of breath and wheezing. Negative for hemoptysis.   Cardiovascular:  Negative for chest pain.     Objective:   Vitals:   06/02/23 0846  BP: 108/72  Pulse: 66  Temp: 97.8 F (36.6 C)  SpO2: 98%  Weight: 180 lb (81.6 kg)  Height: 5\' 2"  (1.575 m)   98% on RA BMI Readings from Last 3 Encounters:  06/02/23 32.92 kg/m  09/12/22 29.26 kg/m  10/15/20 27.42 kg/m   Wt Readings from Last 3 Encounters:  06/02/23 180 lb (81.6 kg)  09/12/22 160 lb (72.6 kg)  10/15/20 149 lb 14.6 oz (68 kg)    Physical Exam Constitutional:      Appearance: Normal appearance.  Cardiovascular:     Rate and Rhythm: Normal rate and regular rhythm.     Pulses: Normal pulses.     Heart sounds: Normal heart sounds.  Pulmonary:     Effort: Pulmonary effort is normal.     Breath sounds: Normal breath sounds. No wheezing or rales.  Musculoskeletal:     Right lower leg: No edema.     Left lower leg: No edema.  Neurological:     General: No focal  deficit present.     Mental Status: She is alert and oriented to person, place, and time. Mental status is at baseline.       Ancillary Information    Past Medical History:  Diagnosis Date   Asthma      Family History  Problem Relation Age of Onset   Hypertension Mother    Hypertension Child      Past Surgical History:  Procedure Laterality Date   TUBAL LIGATION      Social History   Socioeconomic History   Marital status: Married    Spouse name: Not on file   Number of children: Not on file   Years of education: Not on file   Highest education level: Not on file  Occupational History   Not on file  Tobacco Use   Smoking status: Never    Passive exposure: Current   Smokeless tobacco: Never  Vaping Use   Vaping status: Never Used  Substance and Sexual Activity   Alcohol  use: Not Currently   Drug use: Never   Sexual activity: Yes  Other Topics Concern   Not on file  Social History Narrative   Not on file   Social Drivers of Health   Financial Resource Strain: Not on file  Food Insecurity: Not on file  Transportation Needs: Not on file  Physical Activity: Not on file  Stress: Not on file  Social Connections: Not on file  Intimate Partner Violence: Not on file     Allergies  Allergen Reactions   Other     peanuts     CBC    Component Value Date/Time   WBC 11.5 (H) 12/21/2019 1533   RBC 5.35 (H) 12/21/2019 1533   HGB 14.3 12/21/2019 1533   HGB 14.5 02/24/2013 1144   HCT 43.4 12/21/2019 1533   HCT 45.0 02/24/2013 1144   PLT 297 12/21/2019 1533   PLT 222 02/24/2013 1144   MCV 81.1 12/21/2019 1533   MCV 81 02/24/2013 1144   MCH 26.7 12/21/2019 1533   MCHC 32.9 12/21/2019 1533   RDW 14.1 12/21/2019 1533   RDW 13.5 02/24/2013 1144   LYMPHSABS 1.6 12/21/2019 1533   LYMPHSABS 1.1 02/24/2013 1144   MONOABS 0.9 12/21/2019 1533   MONOABS 0.7 02/24/2013 1144   EOSABS 0.0 12/21/2019 1533   EOSABS 0.1 02/24/2013 1144   BASOSABS 0.1 12/21/2019  1533   BASOSABS 0.1 02/24/2013 1144    Pulmonary Functions Testing Results:     No data to display          Outpatient Medications Prior to Visit  Medication Sig Dispense Refill   albuterol (VENTOLIN HFA) 108 (90 Base) MCG/ACT inhaler Inhale 1-2 puffs into the lungs every 6 (six) hours as needed for wheezing or shortness of breath. 1 each 0   Albuterol Sulfate 2.5 MG/0.5ML NEBU Inhale into the lungs every 4 (four) hours as needed.     predniSONE (STERAPRED UNI-PAK 21 TAB) 10 MG (21) TBPK tablet Take by mouth daily. Take 6 tabs by mouth daily  for 2 days, then 5 tabs for 2 days, then 4 tabs for 2 days, then 3 tabs for 2 days, 2 tabs for 2 days, then 1 tab by mouth daily for 2 days 42 tablet 0   No facility-administered medications prior to visit.

## 2023-06-15 LAB — MISC LABCORP TEST (SEND OUT): Labcorp test code: 672858

## 2023-08-12 ENCOUNTER — Telehealth: Payer: Self-pay | Admitting: Student in an Organized Health Care Education/Training Program

## 2023-08-12 DIAGNOSIS — J4550 Severe persistent asthma, uncomplicated: Secondary | ICD-10-CM

## 2023-08-12 MED ORDER — AIRSUPRA 90-80 MCG/ACT IN AERO: 2.0000 | INHALATION_SPRAY | RESPIRATORY_TRACT | 5 refills | Status: DC | PRN

## 2023-08-12 MED ORDER — FLUTICASONE-SALMETEROL 230-21 MCG/ACT IN AERO: 2.0000 | INHALATION_SPRAY | Freq: Two times a day (BID) | RESPIRATORY_TRACT | 12 refills | Status: DC

## 2023-08-12 NOTE — Telephone Encounter (Signed)
 Patient states needs refill for Albuterol inhaler and Advair inhaler. Pharmacy is [facility] [place]. Patient phone number is [phone].

## 2023-08-12 NOTE — Telephone Encounter (Signed)
 I spoke with the patient. She said her pharmacy said there were no more refills on the Advair. Also, she was given a sample of the Airsupra and felt that worked better then the Albuterol. She asked [facility] have [facility]. I have sent in both medications.   Nothing further needed.

## 2023-08-19 ENCOUNTER — Ambulatory Visit: Payer: Self-pay | Attending: Student in an Organized Health Care Education/Training Program

## 2023-08-19 DIAGNOSIS — R0609 Other forms of dyspnea: Secondary | ICD-10-CM | POA: Insufficient documentation

## 2023-08-19 DIAGNOSIS — J4550 Severe persistent asthma, uncomplicated: Secondary | ICD-10-CM | POA: Insufficient documentation

## 2023-08-19 DIAGNOSIS — R0602 Shortness of breath: Secondary | ICD-10-CM | POA: Insufficient documentation

## 2023-08-19 LAB — PULMONARY FUNCTION TEST ARMC ONLY
DL/VA % pred: 129 %
DL/VA: 5.68 ml/min/mmHg/L
DLCO unc % pred: 129 %
DLCO unc: 25.59 ml/min/mmHg
FEF 25-75 Post: 2.75 L/s
FEF 25-75 Pre: 1.53 L/s
FEF2575-%Change-Post: 80 %
FEF2575-%Pred-Post: 100 %
FEF2575-%Pred-Pre: 56 %
FEV1-%Change-Post: 22 %
FEV1-%Pred-Post: 88 %
FEV1-%Pred-Pre: 72 %
FEV1-Post: 2.36 L
FEV1-Pre: 1.93 L
FEV1FVC-%Change-Post: 4 %
FEV1FVC-%Pred-Pre: 91 %
FEV6-%Change-Post: 17 %
FEV6-%Pred-Post: 94 %
FEV6-%Pred-Pre: 80 %
FEV6-Post: 3.09 L
FEV6-Pre: 2.63 L
FEV6FVC-%Pred-Post: 102 %
FEV6FVC-%Pred-Pre: 102 %
FVC-%Change-Post: 17 %
FVC-%Pred-Post: 92 %
FVC-%Pred-Pre: 78 %
FVC-Post: 3.09 L
FVC-Pre: 2.63 L
Post FEV1/FVC ratio: 77 %
Post FEV6/FVC ratio: 100 %
Pre FEV1/FVC ratio: 74 %
Pre FEV6/FVC Ratio: 100 %
RV % pred: 122 %
RV: 2.01 L
TLC % pred: 105 %
TLC: 5.02 L

## 2023-08-19 MED ORDER — ALBUTEROL SULFATE (2.5 MG/3ML) 0.083% IN NEBU
2.5000 mg | INHALATION_SOLUTION | Freq: Once | RESPIRATORY_TRACT | Status: AC
  Administered 2023-08-19: 2.5 mg via RESPIRATORY_TRACT
  Filled 2023-08-19: qty 3

## 2023-08-25 ENCOUNTER — Encounter: Payer: Self-pay | Admitting: Student in an Organized Health Care Education/Training Program

## 2023-08-25 ENCOUNTER — Other Ambulatory Visit
Admission: RE | Admit: 2023-08-25 | Discharge: 2023-08-25 | Disposition: A | Discharge status: Home / Self Care | Payer: Self-pay | Source: Ambulatory Visit | Attending: Student in an Organized Health Care Education/Training Program | Admitting: Student in an Organized Health Care Education/Training Program

## 2023-08-25 ENCOUNTER — Ambulatory Visit (INDEPENDENT_AMBULATORY_CARE_PROVIDER_SITE_OTHER): Payer: Self-pay | Admitting: Student in an Organized Health Care Education/Training Program

## 2023-08-25 VITALS — BP 98/62 | HR 94 | Temp 98.2°F | Ht 63.0 in | Wt 181.4 lb

## 2023-08-25 DIAGNOSIS — J4550 Severe persistent asthma, uncomplicated: Secondary | ICD-10-CM

## 2023-08-25 DIAGNOSIS — R0602 Shortness of breath: Secondary | ICD-10-CM

## 2023-08-25 LAB — NITRIC OXIDE: Nitric Oxide: 12

## 2023-08-25 MED ORDER — FLUTICASONE-SALMETEROL 500-50 MCG/ACT IN AEPB: 1.0000 | INHALATION_SPRAY | Freq: Two times a day (BID) | RESPIRATORY_TRACT | 12 refills | Status: DC

## 2023-08-25 MED ORDER — ALBUTEROL SULFATE HFA 108 (90 BASE) MCG/ACT IN AERS: 2.0000 | INHALATION_SPRAY | Freq: Four times a day (QID) | RESPIRATORY_TRACT | 2 refills | Status: DC | PRN

## 2023-08-25 NOTE — Progress Notes (Unsigned)
 Synopsis: Referred in *** by No ref. provider found  Assessment & Plan:   1. Shortness of breath *** - Nitric oxide  2. Severe persistent asthma without complication (Primary) *** - fluticasone-salmeterol (WIXELA INHUB) 500-50 MCG/ACT AEPB; Inhale 1 puff into the lungs in the morning and at bedtime.  Dispense: 60 each; Refill: 12 - albuterol (VENTOLIN HFA) 108 (90 Base) MCG/ACT inhaler; Inhale 2 puffs into the lungs every 6 (six) hours as needed for wheezing or shortness of breath.  Dispense: 8 g; Refill: 2 - Allergen Panel (27) + IGE  Symptoms got worse 3 weeks ago with a flu, used her inhaler more often. Now closer to baseline, PFT's last week still with reversibility. FENO much better today at 12. Continues to be exposed to alelrgens. Hfa too expensive, switch to generic wixela  Return in about 3 months (around 11/25/2023).  I spent *** minutes caring for this patient today, including {EM billing:[id number]}  Raechel Chute, MD [facility] 08/25/2023 2:38 PM    End of visit medications:  Meds ordered this encounter  Medications   fluticasone-salmeterol (WIXELA INHUB) 500-50 MCG/ACT AEPB    Sig: Inhale 1 puff into the lungs in the morning and at bedtime.    Dispense:  60 each    Refill:  12   albuterol (VENTOLIN HFA) 108 (90 Base) MCG/ACT inhaler    Sig: Inhale 2 puffs into the lungs every 6 (six) hours as needed for wheezing or shortness of breath.    Dispense:  8 g    Refill:  2     Current Outpatient Medications:    albuterol (VENTOLIN HFA) 108 (90 Base) MCG/ACT inhaler, Inhale 2 puffs into the lungs every 6 (six) hours as needed for wheezing or shortness of breath., Disp: 8 g, Rfl: 2   fluticasone-salmeterol (WIXELA INHUB) 500-50 MCG/ACT AEPB, Inhale 1 puff into the lungs in the morning and at bedtime., Disp: 60 each, Rfl: 12   Spacer/Aero-Holding Chambers (AEROCHAMBER MV) inhaler, Use as instructed (Patient not taking: Reported on 08/25/2023), Disp: 1  each, Rfl: 0   Subjective:   PATIENT ID: Hailey Clark GENDER: female DOB: May 15, 1976, MRN: [id number]  Chief Complaint  Patient presents with   Follow-up    Using airsupra 5-6 times per day for sob and wheezing.  If she does not use rescue inhaler in the afternoon, she has wheezing.    HPI ***  Ancillary information including prior medications, full medical/surgical/family/social histories, and PFTs (when available) are listed below and have been reviewed.   ROS   Objective:   Vitals:   08/25/23 1406  BP: 98/62  Pulse: 94  Temp: 98.2 F (36.8 C)  TempSrc: Oral  SpO2: 99%  Weight: 181 lb 6.4 oz (82.3 kg)  Height: 5\' 3"  (1.6 m)   99% on *** LPM *** RA BMI Readings from Last 3 Encounters:  08/25/23 32.13 kg/m  06/02/23 32.92 kg/m  09/12/22 29.26 kg/m   Wt Readings from Last 3 Encounters:  08/25/23 181 lb 6.4 oz (82.3 kg)  06/02/23 180 lb (81.6 kg)  09/12/22 160 lb (72.6 kg)    Physical Exam    Ancillary Information    Past Medical History:  Diagnosis Date   Asthma      Family History  Problem Relation Age of Onset   Hypertension Mother    Hypertension Child      Past Surgical History:  Procedure Laterality Date   TUBAL LIGATION      Social  History   Socioeconomic History   Marital status: Married    Spouse name: Not on file   Number of children: Not on file   Years of education: Not on file   Highest education level: Not on file  Occupational History   Not on file  Tobacco Use   Smoking status: Never    Passive exposure: Current   Smokeless tobacco: Never  Vaping Use   Vaping status: Never Used  Substance and Sexual Activity   Alcohol use: Not Currently   Drug use: Never   Sexual activity: Yes  Other Topics Concern   Not on file  Social History Narrative   Not on file   Social Drivers of Health   Financial Resource Strain: Not on file  Food Insecurity: Not on file  Transportation Needs: Not on file  Physical  Activity: Not on file  Stress: Not on file  Social Connections: Not on file  Intimate Partner Violence: Not on file     Allergies  Allergen Reactions   Other     peanuts     CBC    Component Value Date/Time   WBC 6.4 06/02/2023 1002   RBC 5.59 (H) 06/02/2023 1002   HGB 14.4 06/02/2023 1002   HGB 14.5 02/24/2013 1144   HCT 45.1 06/02/2023 1002   HCT 45.0 02/24/2013 1144   PLT 330 06/02/2023 1002   PLT 222 02/24/2013 1144   MCV 80.7 06/02/2023 1002   MCV 81 02/24/2013 1144   MCH 25.8 (L) 06/02/2023 1002   MCHC 31.9 06/02/2023 1002   RDW 14.3 06/02/2023 1002   RDW 13.5 02/24/2013 1144   LYMPHSABS 2.1 06/02/2023 1002   LYMPHSABS 1.1 02/24/2013 1144   MONOABS 0.6 06/02/2023 1002   MONOABS 0.7 02/24/2013 1144   EOSABS 0.1 06/02/2023 1002   EOSABS 0.1 02/24/2013 1144   BASOSABS 0.1 06/02/2023 1002   BASOSABS 0.1 02/24/2013 1144    Pulmonary Functions Testing Results:    Latest Ref Rng & Units 08/19/2023    4:39 PM  PFT Results  FVC-Pre L 2.63  P  FVC-Predicted Pre % 78  P  FVC-Post L 3.09  P  FVC-Predicted Post % 92  P  Pre FEV1/FVC % % 74  P  Post FEV1/FCV % % 77  P  FEV1-Pre L 1.93  P  FEV1-Predicted Pre % 72  P  FEV1-Post L 2.36  P  DLCO uncorrected ml/min/mmHg 25.59  P  DLCO UNC% % 129  P  DLVA Predicted % 129  P  TLC L 5.02  P  TLC % Predicted % 105  P  RV % Predicted % 122  P    P Preliminary result    Outpatient Medications Prior to Visit  Medication Sig Dispense Refill   Spacer/Aero-Holding Chambers (AEROCHAMBER MV) inhaler Use as instructed (Patient not taking: Reported on 08/25/2023) 1 each 0   albuterol (VENTOLIN HFA) 108 (90 Base) MCG/ACT inhaler Inhale 1-2 puffs into the lungs every 6 (six) hours as needed for wheezing or shortness of breath. (Patient not taking: Reported on 08/25/2023) 1 each 0   Albuterol Sulfate 2.5 MG/0.5ML NEBU Inhale into the lungs every 4 (four) hours as needed. (Patient not taking: Reported on 08/25/2023)      Albuterol-Budesonide (AIRSUPRA) 90-80 MCG/ACT AERO Inhale 2 puffs into the lungs every 4 (four) hours as needed. (Patient not taking: Reported on 08/25/2023) 10.7 g 5   fluticasone-salmeterol (ADVAIR HFA) 230-21 MCG/ACT inhaler Inhale 2 puffs into the lungs  2 (two) times daily. (Patient not taking: Reported on 08/25/2023) 1 each 12   No facility-administered medications prior to visit.

## 2023-08-25 NOTE — Patient Instructions (Signed)
 Today, I ordered blood work. You can get them draw at your preferred [facility] draw station. The nearest one to [facility] is at nearby [facility] ([address] [place], [place], [place] [address]).  We switched from advair HFA to wixela 1 puff twice dialy

## 2023-09-01 LAB — ALLERGEN PANEL (27) + IGE
Alternaria Alternata IgE: <0.1 kU/L
Aspergillus Fumigatus IgE: <0.1 kU/L
Bahia Grass IgE: 0.75 kU/L — AB
Bermuda Grass IgE: 0.48 kU/L — AB
Cat Dander IgE: 5.41 kU/L — AB
Cedar, Mountain IgE: 7.54 kU/L — AB
Cladosporium Herbarum IgE: <0.1 kU/L
Cocklebur IgE: 1.01 kU/L — AB
Cockroach, American IgE: 0.21 kU/L — AB
Common Silver Birch IgE: 1.82 kU/L — AB
D Farinae IgE: 13.8 kU/L — AB
D Pteronyssinus IgE: 17.7 kU/L — AB
Dog Dander IgE: 59.2 kU/L — AB
Elm, American IgE: 4.89 kU/L — AB
Hickory, White IgE: 36 kU/L — AB
IgE (Immunoglobulin E), Serum: 1113 [IU]/mL — ABNORMAL HIGH (ref 6–495)
Johnson Grass IgE: 0.76 kU/L — AB
Kentucky Bluegrass IgE: 0.62 kU/L — AB
Maple/Box Elder IgE: 3.08 kU/L — AB
Mucor Racemosus IgE: 0.18 kU/L — AB
Oak, White IgE: 0.65 kU/L — AB
Penicillium Chrysogen IgE: <0.1 kU/L
Pigweed, Rough IgE: 0.76 kU/L — AB
Plantain, English IgE: 0.33 kU/L — AB
Ragweed, Short IgE: 1.13 kU/L — AB
Setomelanomma Rostrat: <0.1 kU/L
Timothy Grass IgE: 0.4 kU/L — AB
White Mulberry IgE: 0.23 kU/L — AB

## 2023-11-25 ENCOUNTER — Ambulatory Visit: Payer: Self-pay | Admitting: Student in an Organized Health Care Education/Training Program

## 2023-12-15 ENCOUNTER — Encounter: Payer: Self-pay | Admitting: Pulmonary Disease

## 2023-12-23 ENCOUNTER — Ambulatory Visit: Payer: Self-pay | Admitting: Student in an Organized Health Care Education/Training Program

## 2023-12-29 ENCOUNTER — Encounter: Payer: Self-pay | Admitting: Student in an Organized Health Care Education/Training Program

## 2023-12-29 ENCOUNTER — Ambulatory Visit (INDEPENDENT_AMBULATORY_CARE_PROVIDER_SITE_OTHER): Payer: Self-pay | Admitting: Student in an Organized Health Care Education/Training Program

## 2023-12-29 VITALS — BP 112/60 | HR 64 | Temp 98.6°F | Ht 62.0 in | Wt 178.8 lb

## 2023-12-29 DIAGNOSIS — J4550 Severe persistent asthma, uncomplicated: Secondary | ICD-10-CM

## 2023-12-29 DIAGNOSIS — J4540 Moderate persistent asthma, uncomplicated: Secondary | ICD-10-CM

## 2023-12-29 DIAGNOSIS — Z7722 Contact with and (suspected) exposure to environmental tobacco smoke (acute) (chronic): Secondary | ICD-10-CM

## 2023-12-29 MED ORDER — FLUTICASONE-SALMETEROL 250-50 MCG/ACT IN AEPB: 1.0000 | INHALATION_SPRAY | Freq: Two times a day (BID) | RESPIRATORY_TRACT | 11 refills | Status: DC

## 2023-12-29 NOTE — Progress Notes (Unsigned)
 Synopsis: Referred in *** by No ref. provider found  Assessment & Plan:   1. Severe persistent asthma without complication (Primary) *** - fluticasone -salmeterol (WIXELA INHUB) 250-50 MCG/ACT AEPB; Inhale 1 puff into the lungs in the morning and at bedtime.  Dispense: 60 each; Refill: 11  Using wixela intermittently, feels MUCH better Lungs clear Cleaning business, using less chemnicals, noticed difference Will step down therapy, asked her to use wixela at least once daily, twice a day if symptoms exacerbate PFT's had shown response to albuterol   Return in about 6 months (around 06/30/2024).  I spent *** minutes caring for this patient today, including {EM billing:28027}  Belva November, MD [facility] 12/29/2023 10:49 AM    End of visit medications:  Meds ordered this encounter  Medications   fluticasone -salmeterol (WIXELA INHUB) 250-50 MCG/ACT AEPB    Sig: Inhale 1 puff into the lungs in the morning and at bedtime.    Dispense:  60 each    Refill:  11     Current Outpatient Medications:    albuterol  (VENTOLIN  HFA) 108 (90 Base) MCG/ACT inhaler, Inhale 2 puffs into the lungs every 6 (six) hours as needed for wheezing or shortness of breath., Disp: 8 g, Rfl: 2   fluticasone -salmeterol (WIXELA INHUB) 250-50 MCG/ACT AEPB, Inhale 1 puff into the lungs in the morning and at bedtime., Disp: 60 each, Rfl: 11   Spacer/Aero-Holding Chambers (AEROCHAMBER MV) inhaler, Use as instructed, Disp: 1 each, Rfl: 0   Subjective:   PATIENT ID: Hailey Clark: female DOB: [birth year], MRN: [id number]  Chief Complaint  Patient presents with   Follow-up    SOB, severe asthma, x-ray: 06/15/23, PFT: 08/19/23  Breathing has improved, only uses her inhaler once a day in the mornings, but not as often as she used to.    HPI ***  Ancillary information including prior medications, full medical/surgical/family/social histories, and PFTs (when available) are listed  below and have been reviewed.   ROS   Objective:   Vitals:   12/29/23 1027  BP: 112/60  Pulse: 64  Temp: 98.6 F (37 C)  TempSrc: Oral  SpO2: 98%  Weight: 178 lb 12.8 oz (81.1 kg)  Height: 5' 2 (1.575 m)   98% on *** LPM *** RA BMI Readings from Last 3 Encounters:  12/29/23 32.70 kg/m  08/25/23 32.13 kg/m  06/02/23 32.92 kg/m   Wt Readings from Last 3 Encounters:  12/29/23 178 lb 12.8 oz (81.1 kg)  08/25/23 181 lb 6.4 oz (82.3 kg)  06/02/23 180 lb (81.6 kg)    Physical Exam    Ancillary Information    Past Medical History:  Diagnosis Date   Asthma      Family History  Problem Relation Age of Onset   Hypertension Mother    Hypertension Child      Past Surgical History:  Procedure Laterality Date   TUBAL LIGATION      Social History   Socioeconomic History   Marital status: Married    Spouse name: Not on file   Number of children: Not on file   Years of education: Not on file   Highest education level: Not on file  Occupational History   Not on file  Tobacco Use   Smoking status: Never    Passive exposure: Current   Smokeless tobacco: Never  Vaping Use   Vaping status: Never Used  Substance and Sexual Activity   Alcohol use: Not Currently   Drug use: Never  Sexual activity: Yes  Other Topics Concern   Not on file  Social History Narrative   Not on file   Social Drivers of Health   Financial Resource Strain: High Risk (11/26/2023)   Received from [facility]   Overall Financial Resource Strain (CARDIA)    Difficulty of Paying Living Expenses: Hard  Food Insecurity: Food Insecurity Present (11/26/2023)   Received from [facility]   Hunger Vital Sign    Within the past 12 months, you worried that your food would run out before you got the money to buy more.: Sometimes true    Within the past 12 months, the food you bought just didn't last and you didn't have money to get more.: Never true   Transportation Needs: No Transportation Needs (11/26/2023)   Received from [facility] - Transportation    In the past 12 months, has lack of transportation kept you from medical appointments or from getting medications?: No    Lack of Transportation (Non-Medical): No  Physical Activity: Not on file  Stress: Not on file  Social Connections: Not on file  Intimate Partner Violence: Not on file     Allergies  Allergen Reactions   Other     peanuts     CBC    Component Value Date/Time   WBC 6.4 06/02/2023 1002   RBC 5.59 (H) 06/02/2023 1002   HGB 14.4 06/02/2023 1002   HGB 14.5 02/24/2013 1144   HCT 45.1 06/02/2023 1002   HCT 45.0 02/24/2013 1144   PLT 330 06/02/2023 1002   PLT 222 02/24/2013 1144   MCV 80.7 06/02/2023 1002   MCV 81 02/24/2013 1144   MCH 25.8 (L) 06/02/2023 1002   MCHC 31.9 06/02/2023 1002   RDW 14.3 06/02/2023 1002   RDW 13.5 02/24/2013 1144   LYMPHSABS 2.1 06/02/2023 1002   LYMPHSABS 1.1 02/24/2013 1144   MONOABS 0.6 06/02/2023 1002   MONOABS 0.7 02/24/2013 1144   EOSABS 0.1 06/02/2023 1002   EOSABS 0.1 02/24/2013 1144   BASOSABS 0.1 06/02/2023 1002   BASOSABS 0.1 02/24/2013 1144    Pulmonary Functions Testing Results:    Latest Ref Rng & Units 08/19/2023    4:39 PM  PFT Results  FVC-Pre L 2.63   FVC-Predicted Pre % 78   FVC-Post L 3.09   FVC-Predicted Post % 92   Pre FEV1/FVC % % 74   Post FEV1/FCV % % 77   FEV1-Pre L 1.93   FEV1-Predicted Pre % 72   FEV1-Post L 2.36   DLCO uncorrected ml/min/mmHg 25.59   DLCO UNC% % 129   DLVA Predicted % 129   TLC L 5.02   TLC % Predicted % 105   RV % Predicted % 122     Outpatient Medications Prior to Visit  Medication Sig Dispense Refill   albuterol  (VENTOLIN  HFA) 108 (90 Base) MCG/ACT inhaler Inhale 2 puffs into the lungs every 6 (six) hours as needed for wheezing or shortness of breath. 8 g 2   Spacer/Aero-Holding Chambers (AEROCHAMBER MV) inhaler Use as instructed 1  each 0   fluticasone -salmeterol (WIXELA INHUB) 500-50 MCG/ACT AEPB Inhale 1 puff into the lungs in the morning and at bedtime. 60 each 12   No facility-administered medications prior to visit.

## 2024-02-23 ENCOUNTER — Other Ambulatory Visit: Payer: Self-pay | Admitting: Student in an Organized Health Care Education/Training Program

## 2024-02-23 DIAGNOSIS — J4550 Severe persistent asthma, uncomplicated: Secondary | ICD-10-CM

## 2024-05-15 ENCOUNTER — Other Ambulatory Visit: Payer: Self-pay | Admitting: Student in an Organized Health Care Education/Training Program

## 2024-05-15 DIAGNOSIS — J4550 Severe persistent asthma, uncomplicated: Secondary | ICD-10-CM

## 2024-05-15 MED ORDER — ALBUTEROL SULFATE HFA 108 (90 BASE) MCG/ACT IN AERS: 1.0000 | INHALATION_SPRAY | RESPIRATORY_TRACT | 12 refills | Status: AC | PRN

## 2024-05-15 MED ORDER — FLUTICASONE-SALMETEROL 250-50 MCG/ACT IN AEPB: 1.0000 | INHALATION_SPRAY | Freq: Two times a day (BID) | RESPIRATORY_TRACT | 11 refills | Status: AC

## 2024-05-15 NOTE — Telephone Encounter (Signed)
 Copied from CRM #[id number]. Topic: Clinical - Medication Refill >> May 15, 2024  8:42 AM Russell PARAS wrote: Medication:   albuterol  (VENTOLIN  HFA) 108 (90 Base) MCG/ACT inhaler fluticasone -salmeterol (WIXELA INHUB) 250-50 MCG/ACT AEPB  Has the patient contacted their pharmacy? Yes (Agent: If no, request that the patient contact the pharmacy for the refill. If patient does not wish to contact the pharmacy document the reason why and proceed with request.) (Agent: If yes, when and what did the pharmacy advise?)  This is the patient's preferred pharmacy:  [id number] [address] [facility] - [address] [place] [address] Phone: [phone] Fax: [phone]  Is this the correct pharmacy for this prescription? Yes If no, delete pharmacy and type the correct one.   Has the prescription been filled recently? Yes  Is the patient out of the medication? Yes  Has the patient been seen for an appointment in the last year OR does the patient have an upcoming appointment? Yes, 12/29/23 w/ Dgayli  Can we respond through MyChart? Yes  Agent: Please be advised that Rx refills may take up to 3 business days. We ask that you follow-up with your pharmacy.
